# Patient Record
Sex: Female | Born: 1945
Health system: Southern US, Community
[De-identification: ages and names within clinical notes are randomized; demographics above are authoritative.]

## PROBLEM LIST (undated history)

## (undated) DIAGNOSIS — O009 Unspecified ectopic pregnancy without intrauterine pregnancy: Secondary | ICD-10-CM

## (undated) DIAGNOSIS — D649 Anemia, unspecified: Secondary | ICD-10-CM

## (undated) DIAGNOSIS — I10 Essential (primary) hypertension: Secondary | ICD-10-CM

## (undated) DIAGNOSIS — K589 Irritable bowel syndrome without diarrhea: Secondary | ICD-10-CM

## (undated) DIAGNOSIS — D571 Sickle-cell disease without crisis: Secondary | ICD-10-CM

## (undated) DIAGNOSIS — K635 Polyp of colon: Secondary | ICD-10-CM

## (undated) DIAGNOSIS — M810 Age-related osteoporosis without current pathological fracture: Secondary | ICD-10-CM

## (undated) DIAGNOSIS — G4733 Obstructive sleep apnea (adult) (pediatric): Secondary | ICD-10-CM

## (undated) DIAGNOSIS — R197 Diarrhea, unspecified: Secondary | ICD-10-CM

## (undated) DIAGNOSIS — K219 Gastro-esophageal reflux disease without esophagitis: Secondary | ICD-10-CM

## (undated) DIAGNOSIS — D573 Sickle-cell trait: Secondary | ICD-10-CM

## (undated) DIAGNOSIS — H269 Unspecified cataract: Secondary | ICD-10-CM

## (undated) DIAGNOSIS — E559 Vitamin D deficiency, unspecified: Secondary | ICD-10-CM

## (undated) DIAGNOSIS — G4730 Sleep apnea, unspecified: Secondary | ICD-10-CM

## (undated) DIAGNOSIS — Z9989 Dependence on other enabling machines and devices: Principal | ICD-10-CM

## (undated) DIAGNOSIS — M1990 Unspecified osteoarthritis, unspecified site: Secondary | ICD-10-CM

## (undated) DIAGNOSIS — M722 Plantar fascial fibromatosis: Secondary | ICD-10-CM

## (undated) DIAGNOSIS — E739 Lactose intolerance, unspecified: Secondary | ICD-10-CM

## (undated) HISTORY — DX: Lactose intolerance, unspecified: E73.9

## (undated) HISTORY — PX: ABDOMINAL HYSTERECTOMY: SHX81

## (undated) HISTORY — DX: Age-related osteoporosis without current pathological fracture: M81.0

## (undated) HISTORY — DX: Irritable bowel syndrome, unspecified: K58.9

## (undated) HISTORY — DX: Polyp of colon: K63.5

## (undated) HISTORY — DX: Diarrhea, unspecified: R19.7

## (undated) HISTORY — DX: Essential (primary) hypertension: I10

## (undated) HISTORY — DX: Plantar fascial fibromatosis: M72.2

## (undated) HISTORY — DX: Dependence on other enabling machines and devices: Z99.89

## (undated) HISTORY — DX: Sickle-cell trait: D57.3

## (undated) HISTORY — DX: Sickle-cell disease without crisis: D57.1

## (undated) HISTORY — DX: Vitamin D deficiency, unspecified: E55.9

## (undated) HISTORY — DX: Unspecified ectopic pregnancy without intrauterine pregnancy: O00.90

## (undated) HISTORY — PX: TUBAL LIGATION: SHX77

## (undated) HISTORY — DX: Unspecified cataract: H26.9

## (undated) HISTORY — DX: Anemia, unspecified: D64.9

## (undated) HISTORY — DX: Sleep apnea, unspecified: G47.30

## (undated) HISTORY — DX: Obstructive sleep apnea (adult) (pediatric): G47.33

---

## 1997-03-31 HISTORY — PX: BREAST BIOPSY: SHX20

## 1997-09-25 ENCOUNTER — Encounter: Admission: RE | Admit: 1997-09-25 | Discharge: 1997-09-25 | Payer: Self-pay | Admitting: Obstetrics

## 1998-03-26 ENCOUNTER — Encounter: Admission: RE | Admit: 1998-03-26 | Discharge: 1998-03-26 | Payer: Self-pay | Admitting: Obstetrics

## 1998-07-09 ENCOUNTER — Encounter: Admission: RE | Admit: 1998-07-09 | Discharge: 1998-07-09 | Payer: Self-pay | Admitting: Obstetrics

## 1998-07-23 ENCOUNTER — Encounter: Admission: RE | Admit: 1998-07-23 | Discharge: 1998-07-23 | Payer: Self-pay | Admitting: Obstetrics

## 1998-09-17 ENCOUNTER — Encounter: Admission: RE | Admit: 1998-09-17 | Discharge: 1998-09-17 | Payer: Self-pay | Admitting: Obstetrics

## 1998-11-05 ENCOUNTER — Ambulatory Visit (HOSPITAL_COMMUNITY): Admission: RE | Admit: 1998-11-05 | Discharge: 1998-11-05 | Payer: Self-pay | Admitting: Gastroenterology

## 2000-05-08 ENCOUNTER — Encounter: Payer: Self-pay | Admitting: Emergency Medicine

## 2000-05-08 ENCOUNTER — Emergency Department (HOSPITAL_COMMUNITY): Admission: EM | Admit: 2000-05-08 | Discharge: 2000-05-08 | Payer: Self-pay | Admitting: Emergency Medicine

## 2001-12-30 ENCOUNTER — Emergency Department (HOSPITAL_COMMUNITY): Admission: EM | Admit: 2001-12-30 | Discharge: 2001-12-31 | Payer: Self-pay

## 2001-12-30 ENCOUNTER — Encounter: Payer: Self-pay | Admitting: Emergency Medicine

## 2002-01-04 ENCOUNTER — Encounter: Payer: Self-pay | Admitting: General Surgery

## 2002-01-07 ENCOUNTER — Encounter: Payer: Self-pay | Admitting: General Surgery

## 2002-01-07 ENCOUNTER — Encounter (INDEPENDENT_AMBULATORY_CARE_PROVIDER_SITE_OTHER): Payer: Self-pay | Admitting: Specialist

## 2002-01-08 ENCOUNTER — Inpatient Hospital Stay (HOSPITAL_COMMUNITY): Admission: EM | Admit: 2002-01-08 | Discharge: 2002-01-09 | Payer: Self-pay | Admitting: General Surgery

## 2003-07-03 ENCOUNTER — Ambulatory Visit: Admission: RE | Admit: 2003-07-03 | Discharge: 2003-07-03 | Payer: Self-pay | Admitting: Internal Medicine

## 2003-07-03 ENCOUNTER — Encounter (INDEPENDENT_AMBULATORY_CARE_PROVIDER_SITE_OTHER): Payer: Self-pay | Admitting: Cardiology

## 2004-06-07 ENCOUNTER — Ambulatory Visit: Payer: Self-pay | Admitting: *Deleted

## 2004-06-07 ENCOUNTER — Ambulatory Visit: Payer: Self-pay | Admitting: Family Medicine

## 2004-06-10 ENCOUNTER — Ambulatory Visit: Payer: Self-pay | Admitting: Family Medicine

## 2004-08-11 ENCOUNTER — Ambulatory Visit: Payer: Self-pay | Admitting: Internal Medicine

## 2004-10-29 ENCOUNTER — Emergency Department (HOSPITAL_COMMUNITY): Admission: EM | Admit: 2004-10-29 | Discharge: 2004-10-30 | Payer: Self-pay | Admitting: Emergency Medicine

## 2004-11-08 ENCOUNTER — Emergency Department (HOSPITAL_COMMUNITY): Admission: EM | Admit: 2004-11-08 | Discharge: 2004-11-08 | Payer: Self-pay | Admitting: Emergency Medicine

## 2005-03-02 ENCOUNTER — Ambulatory Visit: Payer: Self-pay | Admitting: Family Medicine

## 2005-04-13 ENCOUNTER — Ambulatory Visit: Payer: Self-pay | Admitting: Family Medicine

## 2005-06-03 ENCOUNTER — Ambulatory Visit: Payer: Self-pay | Admitting: Family Medicine

## 2005-07-21 ENCOUNTER — Ambulatory Visit: Payer: Self-pay | Admitting: Family Medicine

## 2006-02-03 ENCOUNTER — Ambulatory Visit: Payer: Self-pay | Admitting: Family Medicine

## 2006-02-17 ENCOUNTER — Ambulatory Visit: Payer: Self-pay | Admitting: Family Medicine

## 2006-02-28 ENCOUNTER — Ambulatory Visit: Payer: Self-pay | Admitting: Family Medicine

## 2006-05-30 HISTORY — PX: CHOLECYSTECTOMY: SHX55

## 2006-08-15 ENCOUNTER — Ambulatory Visit: Payer: Self-pay | Admitting: Family Medicine

## 2006-08-17 ENCOUNTER — Ambulatory Visit: Payer: Self-pay | Admitting: Internal Medicine

## 2006-09-12 ENCOUNTER — Ambulatory Visit: Payer: Self-pay | Admitting: Family Medicine

## 2006-10-12 ENCOUNTER — Ambulatory Visit: Payer: Self-pay | Admitting: Family Medicine

## 2006-12-05 ENCOUNTER — Ambulatory Visit: Payer: Self-pay | Admitting: Family Medicine

## 2007-02-14 ENCOUNTER — Encounter (INDEPENDENT_AMBULATORY_CARE_PROVIDER_SITE_OTHER): Payer: Self-pay | Admitting: *Deleted

## 2007-05-31 HISTORY — PX: COLONOSCOPY: SHX174

## 2007-08-06 ENCOUNTER — Ambulatory Visit: Payer: Self-pay | Admitting: Family Medicine

## 2007-08-06 ENCOUNTER — Encounter (INDEPENDENT_AMBULATORY_CARE_PROVIDER_SITE_OTHER): Payer: Self-pay | Admitting: Family Medicine

## 2007-08-06 LAB — CONVERTED CEMR LAB
ALT: 20 units/L (ref 0–35)
Albumin: 4.2 g/dL (ref 3.5–5.2)
BUN: 8 mg/dL (ref 6–23)
Basophils Relative: 0 % (ref 0–1)
CO2: 27 meq/L (ref 19–32)
Chloride: 100 meq/L (ref 96–112)
Creatinine, Ser: 0.72 mg/dL (ref 0.40–1.20)
Eosinophils Absolute: 0.1 10*3/uL (ref 0.0–0.7)
Hemoglobin: 13.2 g/dL (ref 12.0–15.0)
RBC: 4.89 M/uL (ref 3.87–5.11)
Sodium: 140 meq/L (ref 135–145)
Total Bilirubin: 0.5 mg/dL (ref 0.3–1.2)

## 2007-08-29 ENCOUNTER — Ambulatory Visit: Payer: Self-pay | Admitting: Internal Medicine

## 2007-08-31 ENCOUNTER — Ambulatory Visit: Payer: Self-pay | Admitting: Internal Medicine

## 2007-09-03 ENCOUNTER — Ambulatory Visit: Payer: Self-pay | Admitting: Internal Medicine

## 2008-01-21 ENCOUNTER — Ambulatory Visit: Payer: Self-pay | Admitting: Internal Medicine

## 2008-01-21 DIAGNOSIS — K589 Irritable bowel syndrome without diarrhea: Secondary | ICD-10-CM

## 2008-01-24 ENCOUNTER — Ambulatory Visit: Payer: Self-pay | Admitting: Internal Medicine

## 2008-01-24 ENCOUNTER — Encounter: Payer: Self-pay | Admitting: Internal Medicine

## 2008-01-28 ENCOUNTER — Telehealth (INDEPENDENT_AMBULATORY_CARE_PROVIDER_SITE_OTHER): Payer: Self-pay

## 2008-01-30 ENCOUNTER — Telehealth: Payer: Self-pay | Admitting: Internal Medicine

## 2008-09-05 ENCOUNTER — Ambulatory Visit: Payer: Self-pay | Admitting: Family Medicine

## 2008-09-11 ENCOUNTER — Ambulatory Visit (HOSPITAL_COMMUNITY): Admission: RE | Admit: 2008-09-11 | Discharge: 2008-09-11 | Payer: Self-pay | Admitting: Family Medicine

## 2008-09-23 ENCOUNTER — Ambulatory Visit: Payer: Self-pay | Admitting: Internal Medicine

## 2008-09-25 ENCOUNTER — Ambulatory Visit: Payer: Self-pay | Admitting: Internal Medicine

## 2008-10-09 ENCOUNTER — Ambulatory Visit: Payer: Self-pay | Admitting: Internal Medicine

## 2008-11-13 ENCOUNTER — Ambulatory Visit: Payer: Self-pay | Admitting: Internal Medicine

## 2009-02-10 ENCOUNTER — Telehealth: Payer: Self-pay | Admitting: Internal Medicine

## 2009-03-19 ENCOUNTER — Ambulatory Visit: Payer: Self-pay | Admitting: Internal Medicine

## 2009-03-20 ENCOUNTER — Telehealth: Payer: Self-pay | Admitting: Internal Medicine

## 2009-08-26 ENCOUNTER — Encounter (INDEPENDENT_AMBULATORY_CARE_PROVIDER_SITE_OTHER): Payer: Self-pay | Admitting: Adult Health

## 2009-08-26 ENCOUNTER — Ambulatory Visit: Payer: Self-pay | Admitting: Internal Medicine

## 2009-08-26 LAB — CONVERTED CEMR LAB
Basophils Relative: 1 % (ref 0–1)
GC Probe Amp, Urine: NEGATIVE
HCT: 44.7 % (ref 36.0–46.0)
HDL: 54 mg/dL (ref >39)
LDL Cholesterol: 50 mg/dL (ref 0–99)
Lymphocytes Relative: 44 % (ref 12–46)
Lymphs Abs: 1.8 10*3/uL (ref 0.7–4.0)
Microalb, Ur: 7.32 mg/dL — ABNORMAL HIGH (ref 0.00–1.89)
Monocytes Absolute: 0.3 10*3/uL (ref 0.1–1.0)
Neutro Abs: 1.9 10*3/uL (ref 1.7–7.7)
Platelets: 292 10*3/uL (ref 150–400)
RBC: 5.55 M/uL — ABNORMAL HIGH (ref 3.87–5.11)
VLDL: 32 mg/dL (ref 0–40)

## 2009-09-14 ENCOUNTER — Ambulatory Visit: Payer: Self-pay | Admitting: Family Medicine

## 2009-09-14 ENCOUNTER — Ambulatory Visit (HOSPITAL_COMMUNITY): Admission: RE | Admit: 2009-09-14 | Discharge: 2009-09-14 | Payer: Self-pay | Admitting: Internal Medicine

## 2009-09-16 ENCOUNTER — Ambulatory Visit: Payer: Self-pay | Admitting: Internal Medicine

## 2009-11-18 ENCOUNTER — Emergency Department (HOSPITAL_COMMUNITY): Admission: EM | Admit: 2009-11-18 | Discharge: 2009-11-18 | Payer: Self-pay | Admitting: Family Medicine

## 2010-09-07 ENCOUNTER — Other Ambulatory Visit (HOSPITAL_COMMUNITY): Payer: Self-pay | Admitting: Family Medicine

## 2010-09-07 DIAGNOSIS — M8580 Other specified disorders of bone density and structure, unspecified site: Secondary | ICD-10-CM

## 2010-09-07 DIAGNOSIS — Z1231 Encounter for screening mammogram for malignant neoplasm of breast: Secondary | ICD-10-CM

## 2010-09-09 ENCOUNTER — Ambulatory Visit (HOSPITAL_COMMUNITY)
Admission: RE | Admit: 2010-09-09 | Discharge: 2010-09-09 | Disposition: A | Discharge status: Home / Self Care | Payer: Self-pay | Source: Ambulatory Visit | Attending: Family Medicine | Admitting: Family Medicine

## 2010-09-09 ENCOUNTER — Other Ambulatory Visit (HOSPITAL_COMMUNITY): Payer: Self-pay | Admitting: Family Medicine

## 2010-09-09 DIAGNOSIS — M899 Disorder of bone, unspecified: Secondary | ICD-10-CM | POA: Insufficient documentation

## 2010-09-09 DIAGNOSIS — R52 Pain, unspecified: Secondary | ICD-10-CM

## 2010-09-09 DIAGNOSIS — M8580 Other specified disorders of bone density and structure, unspecified site: Secondary | ICD-10-CM

## 2010-09-16 ENCOUNTER — Ambulatory Visit (HOSPITAL_COMMUNITY)
Admission: RE | Admit: 2010-09-16 | Discharge: 2010-09-16 | Disposition: A | Discharge status: Home / Self Care | Payer: Self-pay | Source: Ambulatory Visit | Attending: Family Medicine | Admitting: Family Medicine

## 2010-09-16 DIAGNOSIS — Z1231 Encounter for screening mammogram for malignant neoplasm of breast: Secondary | ICD-10-CM | POA: Insufficient documentation

## 2010-10-15 NOTE — Op Note (Signed)
Laura Robles, Laura Robles                      ACCOUNT NO.:  [id number]   MEDICAL RECORD NO.:  [id number]                   PATIENT TYPE:  INP   LOCATION:  0270                                 FACILITY:  [facility]   PHYSICIAN:  Adolph Pollack, M.D.            DATE OF BIRTH:  1946-02-03   DATE OF PROCEDURE:  01/07/2002  DATE OF DISCHARGE:                                 OPERATIVE REPORT   PREOPERATIVE DIAGNOSIS:  Symptomatic cholelithiasis.   POSTOPERATIVE DIAGNOSIS:  Acute and chronic cholecystitis.   PROCEDURE:  Laparoscopic cholecystectomy with intraoperative cholangiogram.   SURGEON:  Adolph Pollack, M.D.   ASSISTANT:  Ollen Gross. Carolynne Edouard, M.D.   ANESTHESIA:  General.   INDICATION:  This 65 year old female was seen in the emergency department  with an acute gallbladder attack and noted to have normal liver function  tests and normal white count.  She was seen in the office and scheduled for  __________ and cholecystectomy.  The procedure and the risks were explained  to her preoperatively.   TECHNIQUE:  She was placed supine on the operating table, and a general  anesthetic was administered.  Her abdomen was sterilely prepped and draped.  Local anesthetic was infiltrated in the infraumbilical region, and an  infraumbilical incision was made, incising the skin and subcutaneous tissue  sharply.  The midline fascia was identified and a small incision made in the  midline fascia.  The peritoneal cavity was entered bluntly and under direct  vision.  A pursestring suture of 0 Vicryl was placed around the fascial  edges.  A Hasson trocar was introduced into the peritoneal cavity, and a  pneumoperitoneum was created by insufflation of CO2 gas.  Next, the  laparoscope was introduced.  The patient was placed in a reverse  Trendelenburg position and rotated slightly to the left.  Of note was that  there was omentum adherent to the fundus of the gallbladder.  An 11 mm  trocar was  placed through an epigastric incision and two 5 mm trocars placed  in right abdomen.  The omentum was densely adherent to a distended, inflamed  gallbladder.  I decompressed the gallbladder by placing needle through and  milking out white bile.  I subsequently was able to dissect the omentum off  the fundus of the gallbladder with blunt dissection and cautery.  I used  blunt dissection to identify the body and neck of the gallbladder as well as  identify the infundibulum.  I mobilized the infundibulum of the gallbladder  completely.  I made a small hole in the infundibulum of the gallbladder and  placed a cholangiocatheter through it, performed a cholangiogram.   Under real-time fluoroscopy, contrast material was injected into what  appeared to be the neck of the gallbladder, then drained into a moderate  length cystic duct and then in the common bile duct.  The common hepatic  ducts were seen and appeared to be  patent as did the common bile duct.  The  contrast did splash into the duodenum without delay without evidence of  obstruction.   I removed the cholangiocatheter and noted there was quite a bit of fibrous  reaction around the cystic duct more so than __________ occluded with a  clip.  Subsequently, I brought an EndoGIA stapler and divided the cystic  duct and the surrounding inflammatory response with this.  I clipped and  divided the cystic artery.  The gallbladder was then dissected free from the  liver bed, although there was minimal if any plane and leaving a raw surface  of liver after gallbladder was removed.  It was placed in an Endopouch bag.   I thoroughly irrigated out the perihepatic area and then controlled bleeding  with cautery.  I then placed Surgicel against the raw surface of the liver  and inspected it one more time and noticed no bile leakage or no bleeding.  I irrigated the area with approximately 1.5 liters of solution and evacuated  as much as possible.  I  then placed a drain in the abdominal cavity and  brought it out through the lateral right 5 mm trocar anchored to the skin  with 3-0 nylon suture.  A drain was placed in the gallbladder fossa.  I then  removed the gallbladder through the subumbilical port.  The stone was so  large I had to enlarge the fascial incision.  Once I did this, I closed the  fascia under direct vision with interrupted 0 Vicryl sutures in the  subumbilical region.  I then released the pneumoperitoneum and removed the  trocars.  The remaining skin incisions were closed with 4-0 Monocryl  subcuticular stitches.  Steri-Strips and sterile dressings were applied.   She tolerated the procedure well without any apparent complications and was  taken to the recovery room in satisfactory condition.                                               Adolph Pollack, M.D.    Kari Baars  D:  01/07/2002  T:  01/09/2002  Job:  [id number]

## 2011-08-11 ENCOUNTER — Other Ambulatory Visit (HOSPITAL_COMMUNITY): Payer: Self-pay | Admitting: Internal Medicine

## 2011-08-11 DIAGNOSIS — Z1231 Encounter for screening mammogram for malignant neoplasm of breast: Secondary | ICD-10-CM

## 2011-09-16 ENCOUNTER — Other Ambulatory Visit (HOSPITAL_COMMUNITY): Payer: Self-pay | Admitting: Family Medicine

## 2011-09-16 ENCOUNTER — Ambulatory Visit (HOSPITAL_COMMUNITY)
Admission: RE | Admit: 2011-09-16 | Discharge: 2011-09-16 | Disposition: A | Discharge status: Home / Self Care | Payer: Self-pay | Source: Ambulatory Visit | Attending: Family Medicine | Admitting: Family Medicine

## 2011-09-16 DIAGNOSIS — R52 Pain, unspecified: Secondary | ICD-10-CM

## 2011-09-16 DIAGNOSIS — M25569 Pain in unspecified knee: Secondary | ICD-10-CM | POA: Insufficient documentation

## 2011-09-19 ENCOUNTER — Ambulatory Visit (HOSPITAL_COMMUNITY)
Admission: RE | Admit: 2011-09-19 | Discharge: 2011-09-19 | Disposition: A | Discharge status: Home / Self Care | Payer: Self-pay | Source: Ambulatory Visit | Attending: Internal Medicine | Admitting: Internal Medicine

## 2011-09-19 DIAGNOSIS — Z1231 Encounter for screening mammogram for malignant neoplasm of breast: Secondary | ICD-10-CM | POA: Insufficient documentation

## 2012-09-24 ENCOUNTER — Other Ambulatory Visit (HOSPITAL_COMMUNITY): Payer: Self-pay | Admitting: Internal Medicine

## 2012-09-24 DIAGNOSIS — Z1231 Encounter for screening mammogram for malignant neoplasm of breast: Secondary | ICD-10-CM

## 2012-09-25 ENCOUNTER — Ambulatory Visit (HOSPITAL_COMMUNITY): Payer: Medicare Other

## 2012-09-27 ENCOUNTER — Ambulatory Visit (HOSPITAL_COMMUNITY): Payer: Self-pay

## 2012-10-10 ENCOUNTER — Ambulatory Visit (HOSPITAL_COMMUNITY)
Admission: RE | Admit: 2012-10-10 | Discharge: 2012-10-10 | Disposition: A | Discharge status: Home / Self Care | Payer: Medicare Other | Source: Ambulatory Visit | Attending: Internal Medicine | Admitting: Internal Medicine

## 2012-10-10 DIAGNOSIS — Z1231 Encounter for screening mammogram for malignant neoplasm of breast: Secondary | ICD-10-CM | POA: Insufficient documentation

## 2013-09-05 ENCOUNTER — Other Ambulatory Visit (HOSPITAL_COMMUNITY): Payer: Self-pay | Admitting: Internal Medicine

## 2013-09-05 DIAGNOSIS — Z1231 Encounter for screening mammogram for malignant neoplasm of breast: Secondary | ICD-10-CM

## 2013-10-15 ENCOUNTER — Ambulatory Visit (HOSPITAL_COMMUNITY)
Admission: RE | Admit: 2013-10-15 | Discharge: 2013-10-15 | Disposition: A | Discharge status: Home / Self Care | Payer: Medicare HMO | Source: Ambulatory Visit | Attending: Internal Medicine | Admitting: Internal Medicine

## 2013-10-15 DIAGNOSIS — Z1231 Encounter for screening mammogram for malignant neoplasm of breast: Secondary | ICD-10-CM | POA: Insufficient documentation

## 2014-08-15 DIAGNOSIS — E559 Vitamin D deficiency, unspecified: Secondary | ICD-10-CM | POA: Diagnosis not present

## 2014-08-15 DIAGNOSIS — I10 Essential (primary) hypertension: Secondary | ICD-10-CM | POA: Diagnosis not present

## 2014-08-20 DIAGNOSIS — M810 Age-related osteoporosis without current pathological fracture: Secondary | ICD-10-CM | POA: Diagnosis not present

## 2014-08-20 DIAGNOSIS — I10 Essential (primary) hypertension: Secondary | ICD-10-CM | POA: Diagnosis not present

## 2014-08-20 DIAGNOSIS — Z6830 Body mass index (BMI) 30.0-30.9, adult: Secondary | ICD-10-CM | POA: Diagnosis not present

## 2014-08-20 DIAGNOSIS — Z23 Encounter for immunization: Secondary | ICD-10-CM | POA: Diagnosis not present

## 2014-08-20 DIAGNOSIS — E559 Vitamin D deficiency, unspecified: Secondary | ICD-10-CM | POA: Diagnosis not present

## 2014-08-20 DIAGNOSIS — Z1212 Encounter for screening for malignant neoplasm of rectum: Secondary | ICD-10-CM | POA: Diagnosis not present

## 2014-08-20 DIAGNOSIS — Z1389 Encounter for screening for other disorder: Secondary | ICD-10-CM | POA: Diagnosis not present

## 2014-08-20 DIAGNOSIS — K589 Irritable bowel syndrome without diarrhea: Secondary | ICD-10-CM | POA: Diagnosis not present

## 2014-08-20 DIAGNOSIS — Z0000 Encounter for general adult medical examination without abnormal findings: Secondary | ICD-10-CM | POA: Diagnosis not present

## 2014-09-19 ENCOUNTER — Other Ambulatory Visit (HOSPITAL_COMMUNITY): Payer: Self-pay | Admitting: Internal Medicine

## 2014-09-19 DIAGNOSIS — Z1231 Encounter for screening mammogram for malignant neoplasm of breast: Secondary | ICD-10-CM

## 2014-10-21 DIAGNOSIS — M810 Age-related osteoporosis without current pathological fracture: Secondary | ICD-10-CM | POA: Diagnosis not present

## 2014-10-21 DIAGNOSIS — E559 Vitamin D deficiency, unspecified: Secondary | ICD-10-CM | POA: Diagnosis not present

## 2014-10-22 ENCOUNTER — Ambulatory Visit (HOSPITAL_COMMUNITY)
Admission: RE | Admit: 2014-10-22 | Discharge: 2014-10-22 | Disposition: A | Discharge status: Home / Self Care | Payer: Commercial Managed Care - HMO | Source: Ambulatory Visit | Attending: Internal Medicine | Admitting: Internal Medicine

## 2014-10-22 DIAGNOSIS — Z1231 Encounter for screening mammogram for malignant neoplasm of breast: Secondary | ICD-10-CM | POA: Diagnosis not present

## 2014-11-25 DIAGNOSIS — M545 Low back pain: Secondary | ICD-10-CM | POA: Diagnosis not present

## 2014-11-25 DIAGNOSIS — Z9181 History of falling: Secondary | ICD-10-CM | POA: Diagnosis not present

## 2014-11-25 DIAGNOSIS — Z6830 Body mass index (BMI) 30.0-30.9, adult: Secondary | ICD-10-CM | POA: Diagnosis not present

## 2014-11-26 DIAGNOSIS — Z9181 History of falling: Secondary | ICD-10-CM | POA: Diagnosis not present

## 2015-01-02 ENCOUNTER — Encounter: Payer: Self-pay | Admitting: Internal Medicine

## 2015-04-21 DIAGNOSIS — Z0100 Encounter for examination of eyes and vision without abnormal findings: Secondary | ICD-10-CM | POA: Diagnosis not present

## 2015-05-29 DIAGNOSIS — Z0100 Encounter for examination of eyes and vision without abnormal findings: Secondary | ICD-10-CM | POA: Diagnosis not present

## 2015-08-21 DIAGNOSIS — M810 Age-related osteoporosis without current pathological fracture: Secondary | ICD-10-CM | POA: Diagnosis not present

## 2015-08-21 DIAGNOSIS — I10 Essential (primary) hypertension: Secondary | ICD-10-CM | POA: Diagnosis not present

## 2015-08-21 DIAGNOSIS — E559 Vitamin D deficiency, unspecified: Secondary | ICD-10-CM | POA: Diagnosis not present

## 2015-09-04 DIAGNOSIS — M810 Age-related osteoporosis without current pathological fracture: Secondary | ICD-10-CM | POA: Diagnosis not present

## 2015-09-04 DIAGNOSIS — K589 Irritable bowel syndrome without diarrhea: Secondary | ICD-10-CM | POA: Diagnosis not present

## 2015-09-04 DIAGNOSIS — G629 Polyneuropathy, unspecified: Secondary | ICD-10-CM | POA: Diagnosis not present

## 2015-09-04 DIAGNOSIS — I10 Essential (primary) hypertension: Secondary | ICD-10-CM | POA: Diagnosis not present

## 2015-09-04 DIAGNOSIS — R808 Other proteinuria: Secondary | ICD-10-CM | POA: Diagnosis not present

## 2015-09-04 DIAGNOSIS — E559 Vitamin D deficiency, unspecified: Secondary | ICD-10-CM | POA: Diagnosis not present

## 2015-09-04 DIAGNOSIS — Z1389 Encounter for screening for other disorder: Secondary | ICD-10-CM | POA: Diagnosis not present

## 2015-09-04 DIAGNOSIS — Z6831 Body mass index (BMI) 31.0-31.9, adult: Secondary | ICD-10-CM | POA: Diagnosis not present

## 2015-09-04 DIAGNOSIS — Z0000 Encounter for general adult medical examination without abnormal findings: Secondary | ICD-10-CM | POA: Diagnosis not present

## 2015-09-09 DIAGNOSIS — Z1212 Encounter for screening for malignant neoplasm of rectum: Secondary | ICD-10-CM | POA: Diagnosis not present

## 2015-10-27 ENCOUNTER — Other Ambulatory Visit: Payer: Self-pay

## 2015-10-27 DIAGNOSIS — Z1231 Encounter for screening mammogram for malignant neoplasm of breast: Secondary | ICD-10-CM

## 2015-10-30 ENCOUNTER — Ambulatory Visit: Payer: Medicare HMO

## 2015-11-04 ENCOUNTER — Other Ambulatory Visit: Payer: Self-pay | Admitting: Internal Medicine

## 2015-11-04 ENCOUNTER — Ambulatory Visit
Admission: RE | Admit: 2015-11-04 | Discharge: 2015-11-04 | Disposition: A | Discharge status: Home / Self Care | Payer: Commercial Managed Care - HMO | Source: Ambulatory Visit

## 2015-11-04 DIAGNOSIS — Z1231 Encounter for screening mammogram for malignant neoplasm of breast: Secondary | ICD-10-CM

## 2016-01-25 DIAGNOSIS — H6121 Impacted cerumen, right ear: Secondary | ICD-10-CM | POA: Diagnosis not present

## 2016-01-25 DIAGNOSIS — H9201 Otalgia, right ear: Secondary | ICD-10-CM | POA: Diagnosis not present

## 2016-02-08 DIAGNOSIS — H9201 Otalgia, right ear: Secondary | ICD-10-CM | POA: Diagnosis not present

## 2016-02-08 DIAGNOSIS — Z6830 Body mass index (BMI) 30.0-30.9, adult: Secondary | ICD-10-CM | POA: Diagnosis not present

## 2016-02-24 DIAGNOSIS — H60501 Unspecified acute noninfective otitis externa, right ear: Secondary | ICD-10-CM | POA: Diagnosis not present

## 2016-03-01 DIAGNOSIS — L299 Pruritus, unspecified: Secondary | ICD-10-CM | POA: Diagnosis not present

## 2016-04-13 DIAGNOSIS — Z0100 Encounter for examination of eyes and vision without abnormal findings: Secondary | ICD-10-CM | POA: Diagnosis not present

## 2016-05-30 DIAGNOSIS — G4733 Obstructive sleep apnea (adult) (pediatric): Secondary | ICD-10-CM

## 2016-05-30 HISTORY — DX: Obstructive sleep apnea (adult) (pediatric): G47.33

## 2016-06-21 DIAGNOSIS — Z6831 Body mass index (BMI) 31.0-31.9, adult: Secondary | ICD-10-CM | POA: Diagnosis not present

## 2016-06-21 DIAGNOSIS — R05 Cough: Secondary | ICD-10-CM | POA: Diagnosis not present

## 2016-06-21 DIAGNOSIS — J329 Chronic sinusitis, unspecified: Secondary | ICD-10-CM | POA: Diagnosis not present

## 2016-06-21 DIAGNOSIS — J029 Acute pharyngitis, unspecified: Secondary | ICD-10-CM | POA: Diagnosis not present

## 2016-08-31 DIAGNOSIS — I10 Essential (primary) hypertension: Secondary | ICD-10-CM | POA: Diagnosis not present

## 2016-08-31 DIAGNOSIS — E559 Vitamin D deficiency, unspecified: Secondary | ICD-10-CM | POA: Diagnosis not present

## 2016-09-06 DIAGNOSIS — R808 Other proteinuria: Secondary | ICD-10-CM | POA: Diagnosis not present

## 2016-09-06 DIAGNOSIS — M810 Age-related osteoporosis without current pathological fracture: Secondary | ICD-10-CM | POA: Diagnosis not present

## 2016-09-06 DIAGNOSIS — Z0000 Encounter for general adult medical examination without abnormal findings: Secondary | ICD-10-CM | POA: Diagnosis not present

## 2016-09-06 DIAGNOSIS — G6289 Other specified polyneuropathies: Secondary | ICD-10-CM | POA: Diagnosis not present

## 2016-09-06 DIAGNOSIS — K589 Irritable bowel syndrome without diarrhea: Secondary | ICD-10-CM | POA: Diagnosis not present

## 2016-09-06 DIAGNOSIS — E559 Vitamin D deficiency, unspecified: Secondary | ICD-10-CM | POA: Diagnosis not present

## 2016-09-06 DIAGNOSIS — I10 Essential (primary) hypertension: Secondary | ICD-10-CM | POA: Diagnosis not present

## 2016-09-06 DIAGNOSIS — R0683 Snoring: Secondary | ICD-10-CM | POA: Diagnosis not present

## 2016-09-06 DIAGNOSIS — Z1389 Encounter for screening for other disorder: Secondary | ICD-10-CM | POA: Diagnosis not present

## 2016-09-08 ENCOUNTER — Ambulatory Visit (INDEPENDENT_AMBULATORY_CARE_PROVIDER_SITE_OTHER): Payer: Medicare HMO | Admitting: Neurology

## 2016-09-08 ENCOUNTER — Encounter (INDEPENDENT_AMBULATORY_CARE_PROVIDER_SITE_OTHER): Payer: Self-pay

## 2016-09-08 ENCOUNTER — Encounter: Payer: Self-pay | Admitting: Neurology

## 2016-09-08 VITALS — BP 139/78 | HR 80 | Resp 20 | Ht 63.0 in | Wt 183.0 lb

## 2016-09-08 DIAGNOSIS — R400 Somnolence: Secondary | ICD-10-CM | POA: Diagnosis not present

## 2016-09-08 DIAGNOSIS — R0683 Snoring: Secondary | ICD-10-CM | POA: Diagnosis not present

## 2016-09-08 DIAGNOSIS — E669 Obesity, unspecified: Secondary | ICD-10-CM | POA: Diagnosis not present

## 2016-09-08 DIAGNOSIS — R0681 Apnea, not elsewhere classified: Secondary | ICD-10-CM

## 2016-09-08 NOTE — Progress Notes (Signed)
Subjective:    Patient ID: Laura Robles is a 71 y.o. female.  HPI     Huston Foley, MD, PhD [facility] [address], [address] [address] [place], [place] [address]  Dear Dr. Link Snuffer,   I saw your patient, Laura Robles, upon your kind request in my neurologic clinic today for initial consultation of her sleep disorder, in particular, concern for underlying obstructive sleep apnea. The patient is unaccompanied today. As you know, Laura Robles is a 72 year old right-handed woman with an underlying medical history of osteoporosis, plantar fasciitis, lactose intolerance, IBS, cataracts, hypertension, vitamin D deficiency and obesity, who reports snoring and excessive daytime somnolence.  I reviewed your office note from 09/06/2016, which you kindly included. The patient is single, she lives alone. She quit smoking in 1985, she quit drinking alcohol in 1990, does not drink caffeine on a daily basis. She's a retired Lawyer. She has 2 grown sons, 7 grandchildren, one great-grandchild and one great grandchild on the way. She does not have a set bedtime and wake time. She watches TV in bed at times. Bedtime may vary from 10 PM to 2 AM. Wake up time varies from 7 AM to 10 AM. She does not have night to night nocturia or recurrent morning headaches. She has she had a bedroom before with her friend on a trip out of town and was told that she has loud snoring and also pauses in her breathing. Her grandchildren also reports for snoring to her. She does not always wake up rested. Epworth sleepiness score is 10 out of 24, fatigue score is 12 out of 63. She denies restless leg symptoms but has leg aching at times. She is not aware of any family history of OSA.  Her Past Medical History Is Significant For: Past Medical History:  Diagnosis Date  . Cataracts, bilateral   . Ectopic pregnancy   . Hypertension   . Lactose intolerance   . Osteoporosis   . Plantar fasciitis   . Vitamin  D deficiency     Her Past Surgical History Is Significant For: Past Surgical History:  Procedure Laterality Date  . ABDOMINAL HYSTERECTOMY    . CHOLECYSTECTOMY  2008  . TUBAL LIGATION      Her Family History Is Significant For: Family History  Problem Relation Age of Onset  . Heart attack Father   . Stroke Father   . Diabetes Son     Her Social History Is Significant For: Social History   Social History  . Marital status: Single    Spouse name: N/A  . Number of children: N/A  . Years of education: N/A   Social History Main Topics  . Smoking status: Former Games developer  . Smokeless tobacco: Never Used  . Alcohol use No  . Drug use: No  . Sexual activity: Not on file   Other Topics Concern  . Not on file   Social History Narrative  . No narrative on file    Her Allergies Are:  Allergies  Allergen Reactions  . Ibuprofen   . Penicillins   :   Her Current Medications Are:  Outpatient Encounter Prescriptions as of 09/08/2016  Medication Sig  . alendronate (FOSAMAX) 70 MG tablet Take 70 mg by mouth once a week. Take with a full glass of water on an empty stomach.  . dicyclomine (BENTYL) 20 MG tablet Take 20 mg by mouth daily as needed.  Marland Kitchen lisinopril-hydrochlorothiazide (PRINZIDE,ZESTORETIC) 10-12.5 MG tablet Take 1 tablet  by mouth daily.  Marland Kitchen loperamide (IMODIUM A-D) 2 MG tablet Take 2 mg by mouth 4 (four) times daily as needed for diarrhea or loose stools.  . Probiotic Product (PROBIOTIC DAILY PO) Take by mouth.   No facility-administered encounter medications on file as of 09/08/2016.   :  Review of Systems:  Out of a complete 14 point review of systems, all are reviewed and negative with the exception of these symptoms as listed below:  Review of Systems  Neurological:       Pt presents today to discuss her sleep. Pt has never had a sleep study but does endorse snoring.  Epworth Sleepiness Scale 0= would never doze 1= slight chance of dozing 2= moderate chance  of dozing 3= high chance of dozing  Sitting and reading: 2 Watching TV: 2 Sitting inactive in a public place (ex. Theater or meeting): 1 As a passenger in a car for an hour without a break: 2 Lying down to rest in the afternoon: 1 Sitting and talking to someone: 0 Sitting quietly after lunch (no alcohol): 2 In a car, while stopped in traffic: 0 Total: 10      Objective:  Neurologic Exam  Physical Exam Physical Examination:   Vitals:   09/08/16 1622  BP: 139/78  Pulse: 80  Resp: 20   General Examination: The patient is a very pleasant 71 y.o. female in no acute distress. She appears well-developed and well-nourished and well groomed.    HEENT: Normocephalic, atraumatic, pupils are equal, round and reactive to light and accommodation. Extraocular tracking is good without limitation to gaze excursion or nystagmus noted. Normal smooth pursuit is noted. Hearing is grossly intact. Face is symmetric with normal facial animation and normal facial sensation. Speech is clear with no dysarthria noted. There is no hypophonia. There is no lip, neck/head, jaw or voice tremor. Neck is supple with full range of passive and active motion. There are no carotid bruits on auscultation. Oropharynx exam reveals: mild mouth dryness, adequate dental hygiene and mild airway crowding, due to smaller airway and redundant soft palate, tonsils in place. Mallampati is class II. Tongue protrudes centrally and palate elevates symmetrically. Tonsils are 1+ in size/absent. Neck size is 14.5 inches. She has a Mild overbite.    Chest: Clear to auscultation without wheezing, rhonchi or crackles noted.  Heart: S1+S2+0, regular and normal without murmurs, rubs or gallops noted.   Abdomen: Soft, non-tender and non-distended with normal bowel sounds appreciated on auscultation.  Extremities: There is no pitting edema in the distal lower extremities bilaterally. Pedal pulses are intact.  Skin: Warm and dry without  trophic changes noted.  Musculoskeletal: exam reveals no obvious joint deformities, tenderness or joint swelling or erythema.   Neurologically:  Mental status: The patient is awake, alert and oriented in all 4 spheres. Her immediate and remote memory, attention, language skills and fund of knowledge are appropriate. There is no evidence of aphasia, agnosia, apraxia or anomia. Speech is clear with normal prosody and enunciation. Thought process is linear. Mood is normal and affect is normal.  Cranial nerves II - XII are as described above under HEENT exam. In addition: shoulder shrug is normal with equal shoulder height noted. Motor exam: Normal bulk, strength and tone is noted. There is no drift, tremor or rebound. Romberg is negative. Reflexes are 1-2+ throughout. Fine motor skills and coordination: intact with normal finger taps, normal hand movements, normal rapid alternating patting, normal foot taps and normal foot agility.  Cerebellar  testing: No dysmetria or intention tremor on finger to nose testing. Heel to shin is unremarkable bilaterally. There is no truncal or gait ataxia.  Sensory exam: intact to light touch, vibration, temperature sense in the upper and lower extremities.  Gait, station and balance: She stands easily. No veering to one side is noted. No leaning to one side is noted. Posture is age-appropriate and stance is narrow based. Gait shows normal stride length and normal pace. No problems turning are noted. Tandem walk is unremarkable for age.  Assessment and Plan:  In summary, LATRELL REITAN is a very pleasant 71 y.o.-year old female with an underlying medical history of osteoporosis, plantar fasciitis, lactose intolerance, IBS, cataracts, hypertension, vitamin D deficiency and obesity, whose history and physical exam are concerning for obstructive sleep apnea (OSA). I had a long chat with the patient about my findings and the diagnosis of OSA, its prognosis and treatment  options. We talked about medical treatments, surgical interventions and non-pharmacological approaches. I explained in particular the risks and ramifications of untreated moderate to severe OSA, especially with respect to developing cardiovascular disease down the Road, including congestive heart failure, difficult to treat hypertension, cardiac arrhythmias, or stroke. Even type 2 diabetes has, in part, been linked to untreated OSA. Symptoms of untreated OSA include daytime sleepiness, memory problems, mood irritability and mood disorder such as depression and anxiety, lack of energy, as well as recurrent headaches, especially morning headaches. We talked about trying to maintain a healthy lifestyle in general, as well as the importance of weight control. I encouraged the patient to eat healthy, exercise daily and keep well hydrated, to keep a scheduled bedtime and wake time routine, to not skip any meals and eat healthy snacks in between meals. I advised the patient not to drive when feeling sleepy. I recommended the following at this time: sleep study with potential positive airway pressure titration. (We will score hypopneas at 4%).   I explained the sleep test procedure to the patient and also outlined possible surgical and non-surgical treatment options of OSA, including the use of a custom-made dental device (which would require a referral to a specialist dentist or oral surgeon), upper airway surgical options, such as pillar implants, radiofrequency surgery, tongue base surgery, and UPPP (which would involve a referral to an ENT surgeon). Rarely, jaw surgery such as mandibular advancement may be considered.  I also explained the CPAP treatment option to the patient, who indicated that she would be willing to try CPAP if the need arises. I explained the importance of being compliant with PAP treatment, not only for insurance purposes but primarily to improve Her symptoms, and for the patient's long term  health benefit, including to reduce Her cardiovascular risks. I answered all her questions today and the patient was in agreement. I would like to see her back after the sleep study is completed and encouraged her to call with any interim questions, concerns, problems or updates.   Thank you very much for allowing me to participate in the care of this nice patient. If I can be of any further assistance to you please do not hesitate to call me at [phone].  Sincerely,   Huston Foley, MD, PhD

## 2016-09-08 NOTE — Patient Instructions (Signed)

## 2016-09-19 DIAGNOSIS — Z0100 Encounter for examination of eyes and vision without abnormal findings: Secondary | ICD-10-CM | POA: Diagnosis not present

## 2016-09-20 DIAGNOSIS — Z1212 Encounter for screening for malignant neoplasm of rectum: Secondary | ICD-10-CM | POA: Diagnosis not present

## 2016-09-27 ENCOUNTER — Other Ambulatory Visit: Payer: Self-pay | Admitting: Internal Medicine

## 2016-09-27 DIAGNOSIS — Z1231 Encounter for screening mammogram for malignant neoplasm of breast: Secondary | ICD-10-CM

## 2016-10-06 ENCOUNTER — Telehealth: Payer: Self-pay | Admitting: Neurology

## 2016-10-06 DIAGNOSIS — E669 Obesity, unspecified: Secondary | ICD-10-CM

## 2016-10-06 DIAGNOSIS — R0681 Apnea, not elsewhere classified: Secondary | ICD-10-CM

## 2016-10-06 DIAGNOSIS — M810 Age-related osteoporosis without current pathological fracture: Secondary | ICD-10-CM | POA: Diagnosis not present

## 2016-10-06 DIAGNOSIS — R0683 Snoring: Secondary | ICD-10-CM

## 2016-10-06 DIAGNOSIS — R400 Somnolence: Secondary | ICD-10-CM

## 2016-10-06 NOTE — Telephone Encounter (Signed)
[employer] denied split but approved HST.  Can I get an order for HST? °

## 2016-10-06 NOTE — Telephone Encounter (Signed)
Order is in.

## 2016-10-12 ENCOUNTER — Encounter: Payer: Self-pay | Admitting: Internal Medicine

## 2016-10-17 ENCOUNTER — Ambulatory Visit (INDEPENDENT_AMBULATORY_CARE_PROVIDER_SITE_OTHER): Payer: Medicare HMO | Admitting: Neurology

## 2016-10-17 DIAGNOSIS — G4733 Obstructive sleep apnea (adult) (pediatric): Secondary | ICD-10-CM | POA: Diagnosis not present

## 2016-10-25 NOTE — Procedures (Signed)
  [facility] [address] [place], [place] [id number] NAME: Laura SoberVivian Siegel DOB: 07/11/1945 MEDICAL RECORD [id number] DOS: 10/17/16 REFERRING PHYSICIAN: Cletis AthensScott Holwerda,MD Study performed: HST HISTORY:  71 year old woman with a history of osteoporosis, plantar fasciitis, lactose intolerance, IBS, cataracts, hypertension, vitamin D deficiency and obesity, who reports snoring and excessive daytime somnolence. Epworth sleepiness score is 10 out of 24, fatigue score is 12 out of 63. BMI of 32.4. STUDY RESULTS: Total Recording Time: 8h 5311m Total Apnea/Hypopnea Index (AHI): 26.9/hour Average Oxygen Saturation: 95% Lowest Oxygen Saturation: 77% Average Heart Rate: 72 bpm  IMPRESSION: Obstructive Sleep Apnea OSA RECOMMENDATION: This home sleep test demonstrates overall moderate obstructive sleep apnea with a total AHI of 26.9/hour and O2 nadir of 77%. Given the patient's medical history and sleep related complaints, treatment with positive airway pressure (in the form of CPAP) is recommended. This will require a full night CPAP titration study for proper treatment settings and mask fitting. Please note that untreated obstructive sleep apnea carries additional perioperative morbidity. Patients with significant obstructive sleep apnea should receive perioperative PAP therapy and the surgeons and particularly the anesthesiologist should be informed of the diagnosis and the severity of the sleep disordered breathing. The patient should be cautioned not to drive, work at heights, or operate dangerous or heavy equipment when tired or sleepy. Review and reiteration of good sleep hygiene measures should be pursued with any patient. The patient and his referring provider will be notified of the test results. The [facility]  I certify that I have reviewed the raw data recording prior to the issuance of this report in accordance with  the standards of Accreditation of the American Academy of Sleep medicine (AASM) Huston FoleySaima Cedric Mcclaine, MD, PhD Diplomat, American Board of Psychiatry and Neurology (Neurology and Sleep Medicine)

## 2016-10-25 NOTE — Progress Notes (Signed)
Patient referred by Dr. Link SnufferHolwerda, seen by me on 09/08/16, HST on 10/17/16:  Please call and notify the patient that the recent home sleep test did suggest the diagnosis of moderate obstructive sleep apnea and that I recommend treatment for this in the form of CPAP. I will request an overnight sleep study for proper titration and mask fitting. Please explain to patient and arrange for a CPAP titration study. I have placed an order in the chart. Thanks, and [facility].   Huston FoleySaima Riki Gehring, MD, PhD [facility])

## 2016-10-25 NOTE — Addendum Note (Signed)
Addended by: Huston FoleyATHAR, Jillana Selph on: 10/25/2016 06:45 PM   Modules accepted: Orders

## 2016-10-27 ENCOUNTER — Telehealth: Payer: Self-pay

## 2016-10-27 NOTE — Telephone Encounter (Signed)
-----   Message from Huston FoleySaima Athar, MD sent at 10/25/2016  6:45 PM EDT ----- Patient referred by Dr. Link SnufferHolwerda, seen by me on 09/08/16, HST on 10/17/16:  Please call and notify the patient that the recent home sleep test did suggest the diagnosis of moderate obstructive sleep apnea and that I recommend treatment for this in the form of CPAP. I will request an overnight sleep study for proper titration and mask fitting. Please explain to patient and arrange for a CPAP titration study. I have placed an order in the chart. Thanks, and [facility].   Huston FoleySaima Athar, MD, PhD [facility])

## 2016-10-27 NOTE — Telephone Encounter (Signed)
Patient called back. She is aware of results and and recommendations. She is willing to proceed with titration study. I have sent copy of report to PCP.

## 2016-10-27 NOTE — Telephone Encounter (Signed)
LM on home and cell phone for patient to call back for results.

## 2016-11-08 ENCOUNTER — Ambulatory Visit
Admission: RE | Admit: 2016-11-08 | Discharge: 2016-11-08 | Disposition: A | Discharge status: Home / Self Care | Payer: Commercial Managed Care - HMO | Source: Ambulatory Visit | Attending: Internal Medicine | Admitting: Internal Medicine

## 2016-11-08 DIAGNOSIS — Z1231 Encounter for screening mammogram for malignant neoplasm of breast: Secondary | ICD-10-CM | POA: Diagnosis not present

## 2016-11-29 ENCOUNTER — Encounter (INDEPENDENT_AMBULATORY_CARE_PROVIDER_SITE_OTHER): Payer: Self-pay

## 2016-11-29 ENCOUNTER — Encounter: Payer: Self-pay | Admitting: Internal Medicine

## 2016-11-29 ENCOUNTER — Ambulatory Visit (INDEPENDENT_AMBULATORY_CARE_PROVIDER_SITE_OTHER): Payer: Commercial Managed Care - HMO | Admitting: Internal Medicine

## 2016-11-29 VITALS — BP 138/80 | HR 80 | Ht 63.5 in | Wt 176.2 lb

## 2016-11-29 DIAGNOSIS — K580 Irritable bowel syndrome with diarrhea: Secondary | ICD-10-CM | POA: Diagnosis not present

## 2016-11-29 NOTE — Progress Notes (Signed)
Laura Robles 70 y.o. 05-15-1946 [id number] Encounter Diagnosis  Name Primary?  . Irritable bowel syndrome with diarrhea Yes    Assessment & Plan:   1. IBS-D - Lond[place], chronic problem for the past 9-10 years. Colonoscopy from 2009 was normal and included biopsies, which were negative. Seems relatively well controlled with 1-2 imodium a day and avoiding trigger foods like dairy. Though it is safe for her to take imodium and dicyclomine together, it seems that the dicyclomine is not helping so instructed her to discontinue. Discussed the potential benefit of probiotics, and provided her with 1 month of samples of VSL# 3. Insrtucted her to call or send a message on MyChart with questions or concerns. Follow up for colonoscopy next year.   I have personally seen the patient, reviewed and repeated key elements of the history and physical and participated in formation of the assessment and plan the student has documented. IBS-D - managed well by 1-2 imodium a day. Does not need dicyclomine. Trial of probiotic also.  Iva Booparl E. Gessner, MD, Clementeen GrahamFACG' Cc:Alysia PennaHolwerda, Scott, MD   Subjective:   Chief Complaint: IBS-D, questions about medication  HPI Laura Robles is a very nice 71 year old woman who's had IBS-D and lactose intolerance for the past 9-10 years, relatively well controlled with 1-2 imodium per day. She is here today with questions about whether or not she can take imodium and dicyclomine at the same time, and whether there is anything else she can do to further improve her management of the problem. She's had a prescription for dicyclomine 20mg  for many years, but was told by a pharmacist not to take it at the same time as imodium. As a result she's stopped taking it, and doesn't feel it was helping her anyway. She says she has taken a round of probiotics in the past which she thinks helped her.   She usually has 1-2 loose bowel movements a day, not bloody or greasy, and generally  not associated with abdominal pain or nausea. If she eats at a restaurant or eats dairy she will have multiple bowel movements a day, and takes an extra imodium to control. Reports that she "almost never" wakes up at night with diarrhea. She reports a stable weight but our records show that she's lost 7 lbs over the past 3 months.   She is also asking about some "bug bites" she got from camping a few weeks ago on her right side near her breast. They are itchy and haven't gone away over the weeks.   Wt Readings from Last 3 Encounters:  11/29/16 176 lb 4 oz (79.9 kg)  09/08/16 183 lb (83 kg)  03/19/09 173 lb 6.1 oz (78.6 kg)    Allergies  Allergen Reactions  . Ibuprofen   . Penicillins    Current Meds  Medication Sig  . lisinopril-hydrochlorothiazide (PRINZIDE,ZESTORETIC) 10-12.5 MG tablet Take 1 tablet by mouth daily.  Marland Kitchen. loperamide (IMODIUM A-D) 2 MG tablet Take 2 mg by mouth 4 (four) times daily as needed for diarrhea or loose stools.  . Probiotic Product (VSL#3 DS) PACK Take by mouth. 1 pack daily  . [DISCONTINUED] Probiotic Product (PROBIOTIC DAILY PO) Take by mouth.   Past Medical History:  Diagnosis Date  . Cataracts, bilateral   . Ectopic pregnancy   . Hypertension   . IBS (irritable bowel syndrome)   . Lactose intolerance   . Osteoporosis   . Plantar fasciitis   . Vitamin D deficiency  Past Surgical History:  Procedure Laterality Date  . ABDOMINAL HYSTERECTOMY    . BREAST BIOPSY Left 03/31/1997   benign  . CHOLECYSTECTOMY  2008  . COLONOSCOPY  2009   negative screening  . TUBAL LIGATION     Social History   Social History  . Marital status: Single    Spouse name: N/A  . Number of children: 2   Occupational History  . retired    Social History Main Topics  . Smoking status: Former Smoker    Types: Cigarettes    Quit date: 05/31/1983  . Smokeless tobacco: Never Used  . Alcohol use No  . Drug use: No    family history includes Diabetes in her son;  Heart attack in her father; Stroke in her father and paternal grandmother.   Review of Systems Pertinent noted in HPI. Otherwise negative.   Objective:   Physical Exam @BP  138/80 (BP Location: Left Arm, Patient Position: Sitting, Cuff Size: Normal)   Pulse 80   Ht 5' 3.5" (1.613 m) Comment: height measured without shoes  Wt 176 lb 4 oz (79.9 kg)   BMI 30.73 kg/m @  General:  Calm, attentive. Over weight. In no acute distress Skin:  About 5 papules on right lateral trunk, near breast. Not erythematous. Do not look infected.  Eyes:  Anicteric.Cataracts visible.  ENT:   Mouth and posterior pharynx free of lesions.  Neck:   supple w/o thyromegaly or mass.  Lungs: Clear to auscultation bilaterally. Heart:  S1S2, no rubs, murmurs, gallops. Abdomen:  soft, non-tender, no hepatosplenomegaly, hernia, or mass and BS+.  Rectal: Not done. Lymph:  no cervical or supraclavicular adenopathy. Extremities:   no edema, cyanosis or clubbing Skin   no rash. Neuro:  A&O x 3.  Psych:  appropriate mood and  Affect.   Data Reviewed: PCP notes, labs, prior endoscopy reports reviewed

## 2016-11-29 NOTE — Patient Instructions (Signed)
  Today we are providing you with samples of VSL #3 DS, take one packet daily.  If this helps you can purchase the lower strength over the counter to take.  If you have questions call us back.  Stop your dicyclomine per Dr Leone PayorGessner.  Continue your Imodium per Dr Leone PayorGessner.   I appreciate the opportunity to care for you. Stan Headarl Gessner, MD, [facility]AC[facility]

## 2016-11-30 ENCOUNTER — Encounter: Payer: Self-pay | Admitting: Internal Medicine

## 2016-12-01 ENCOUNTER — Ambulatory Visit (INDEPENDENT_AMBULATORY_CARE_PROVIDER_SITE_OTHER): Payer: Medicare HMO | Admitting: Neurology

## 2016-12-01 DIAGNOSIS — G4733 Obstructive sleep apnea (adult) (pediatric): Secondary | ICD-10-CM

## 2016-12-06 NOTE — Addendum Note (Signed)
Addended by: Huston FoleyATHAR, Zolton Dowson on: 12/06/2016 08:39 AM   Modules accepted: Orders

## 2016-12-06 NOTE — Procedures (Signed)
PATIENT'S NAME:  Laura Robles, Laura Robles DOB:      1945/06/09      MR#:    [id number]     DATE OF RECORDING: 12/01/2016 REFERRING M.D.:  Alysia PennaScott Holwerda, MD Study Performed:   CPAP  Titration HISTORY:  71 year old right-handed woman with an underlying medical history of osteoporosis, plantar fasciitis, lactose intolerance, IBS, cataracts, hypertension, vitamin D deficiency and obesity, who presents for a CPAP titration. A Home sleep test performed on 10/17/2016 demonstrated overall moderate obstructive sleep apnea with a total AHI of 26.9/hour and O2 nadir of 77%. The patient endorsed the Epworth Sleepiness Scale at 10 points and the Fatigue Score at 12 points. The patient's weight 183 pounds with a height of 63 (inches), resulting in a BMI of 32.4 kg/m2. The patient's neck circumference measured 14.5 inches.  CURRENT MEDICATIONS: Fosamax, Bentyl, Prinzide, Imodium A-D, Probiotic  PROCEDURE:  This is a multichannel digital polysomnogram utilizing the SomnoStar 11.2 system.  Electrodes and sensors were applied and monitored per AASM Specifications.   EEG, EOG, Chin and Limb EMG, were sampled at 200 Hz.  ECG, Snore and Nasal Pressure, Thermal Airflow, Respiratory Effort, CPAP Flow and Pressure, Oximetry was sampled at 50 Hz. Digital video and audio were recorded.      The patient was fitted with a medium Eson 2 nasal mask. CPAP was initiated at 5 cmH20 with heated humidity per AASM standards and pressure was advanced to 9 cmH20 because of hypopneas, apneas and desaturations.  At a PAP pressure of 9 cmH20, there was a reduction of the AHI to 0/hour, with supine REM sleep achieved, O2 nadir of 95%.    Lights Out was at 22:22 and Lights On at 04:59. Total recording time (TRT) was 398 minutes, with a total sleep time (TST) of 319 minutes. The patient's sleep latency was 4 minutes. REM latency was 64.5 minutes, which is borderline reduced. The sleep efficiency was 80.2 %.    SLEEP ARCHITECTURE: WASO (Wake after sleep  onset) was elevated, with inability to return to sleep after 03:56 AM. There were 3 minutes in Stage N1, 149 minutes Stage N2, 102.5 minutes Stage N3 and 64.5 minutes in Stage REM.  The percentage of Stage N1 was .9%, Stage N2 was 46.7%, which is normal, Stage N3 was 32.1%, which is increased, and Stage R (REM sleep) was 20.2%, which is normal.  The arousals were noted as: 15 were spontaneous, 0 were associated with PLMs, 0 were associated with respiratory events.  Audio and video analysis did not show any abnormal or unusual movements, behaviors, phonations or vocalizations.  The patient took 1 bathroom break. The EKG was in keeping with normal sinus rhythm (NSR).  RESPIRATORY ANALYSIS:  There was a total of 7 respiratory events: 6 obstructive apneas, 0 central apneas and 0 mixed apneas with a total of 6 apneas and an apnea index (AI) of 1.1 /hour. There were 1 hypopneas with a hypopnea index of .2/hour. The patient also had 0 respiratory event related arousals (RERAs).      The total APNEA/HYPOPNEA INDEX  (AHI) was 1.3 /hour and the total RESPIRATORY DISTURBANCE INDEX was 1.3 .hour  5 events occurred in REM sleep and 2 events in NREM. The REM AHI was 4.7 /hour versus a non-REM AHI of .5 /hour.  The patient spent 319 minutes of total sleep time in the supine position and 0 minutes in non-supine. The supine AHI was 1.3, versus a non-supine AHI of 0.0.  OXYGEN SATURATION & C02:  The  baseline 02 saturation was 98%, with the lowest being 87%. Time spent below 89% saturation equaled 1 minutes.  PERIODIC LIMB MOVEMENTS: The patient had a total of 0 Periodic Limb Movements. The Periodic Limb Movement (PLM) index was 0 and the PLM Arousal index was 0 /hour.  Post-study, the patient indicated that sleep was better than usual.   DIAGNOSIS  1. Obstructive Sleep Apnea   PLANS/RECOMMENDATIONS:  1. This study demonstrates resolution of the patient's obstructive sleep apnea with CPAP therapy. I will,  therefore, start the patient on home CPAP treatment at a pressure of 9 cm via medium nasal mask with heated humidity. The patient should be reminded to be fully compliant with PAP therapy to improve sleep related symptoms and decrease long term cardiovascular risks. The patient should be reminded, that it may take up to 3 months to get fully used to using PAP with all planned sleep. The earlier full compliance is achieved, the better long term compliance tends to be. Please note that untreated obstructive sleep apnea carries additional perioperative morbidity. Patients with significant obstructive sleep apnea should receive perioperative PAP therapy and the surgeons and particularly the anesthesiologist should be informed of the diagnosis and the severity of the sleep disordered breathing. 2. The patient should be cautioned not to drive, work at heights, or operate dangerous or heavy equipment when tired or sleepy. Review and reiteration of good sleep hygiene measures should be pursued with any patient. 3. The patient will be seen in follow-up by Dr. Frances Furbish at [facility] for discussion of the test results and further management strategies. The referring provider will be notified of the test results.  I certify that I have reviewed the entire raw data recording prior to the issuance of this report in accordance with the Standards of Accreditation of the American Academy of Sleep Medicine (AASM)   Huston Foley, MD, PhD Diplomat, American Board of Psychiatry and Neurology (Neurology and Sleep Medicine)

## 2016-12-06 NOTE — Progress Notes (Signed)
Patient referred by Dr. Link SnufferHolwerda, seen by me on 09/08/16, HST on 10/17/16, CPAP titration on 12/01/16:  Please call and inform patient that I have entered an order for treatment with positive airway pressure (PAP) treatment of obstructive sleep apnea (OSA). She did well during the latest sleep study with CPAP. We will, therefore, arrange for a machine for home use through a DME (durable medical equipment) company of Her choice; and I will see the patient back in follow-up in about 10 weeks. Please also explain to the patient that I will be looking out for compliance data, which can be downloaded from the machine (stored on an SD card, that is inserted in the machine) or via remote access through a modem, that is built into the machine. At the time of the followup appointment we will discuss sleep study results and how it is going with PAP treatment at home. Please advise patient to bring Her machine at the time of the first FU visit, even though this is cumbersome. Bringing the machine for every visit after that will likely not be needed, but often helps for the first visit to troubleshoot if needed. Please re-enforce the importance of compliance with treatment and the need for us to monitor compliance data - often an insurance requirement and actually good feedback for the patient as far as how they are doing.  Also remind patient, that any interim PAP machine or mask issues should be first addressed with the DME company, as they can often help better with technical and mask fit issues. Please ask if patient has a preference regarding DME company.  Please also make sure, the patient has a follow-up appointment with me or Eber JonesCarolyn or Aundra MilletMegan in about 10 weeks from the setup date, thanks.  Once you have spoken to the patient - and faxed/routed report to PCP and referring MD (if other than PCP), you can close this encounter, thanks,   Huston FoleySaima Latajah Thuman, MD, PhD [facility] ([facility]

## 2016-12-07 ENCOUNTER — Telehealth: Payer: Self-pay

## 2016-12-07 NOTE — Telephone Encounter (Signed)
-----   Message from Huston FoleySaima Athar, MD sent at 12/06/2016  8:39 AM EDT ----- Patient referred by Dr. Link SnufferHolwerda, seen by me on 09/08/16, HST on 10/17/16, CPAP titration on 12/01/16:  Please call and inform patient that I have entered an order for treatment with positive airway pressure (PAP) treatment of obstructive sleep apnea (OSA). She did well during the latest sleep study with CPAP. We will, therefore, arrange for a machine for home use through a DME (durable medical equipment) company of Her choice; and I will see the patient back in follow-up in about 10 weeks. Please also explain to the patient that I will be looking out for compliance data, which can be downloaded from the machine (stored on an SD card, that is inserted in the machine) or via remote access through a modem, that is built into the machine. At the time of the followup appointment we will discuss sleep study results and how it is going with PAP treatment at home. Please advise patient to bring Her machine at the time of the first FU visit, even though this is cumbersome. Bringing the machine for every visit after that will likely not be needed, but often helps for the first visit to troubleshoot if needed. Please re-enforce the importance of compliance with treatment and the need for us to monitor compliance data - often an insurance requirement and actually good feedback for the patient as far as how they are doing.  Also remind patient, that any interim PAP machine or mask issues should be first addressed with the DME company, as they can often help better with technical and mask fit issues. Please ask if patient has a preference regarding DME company.  Please also make sure, the patient has a follow-up appointment with me or Eber JonesCarolyn or Aundra MilletMegan in about 10 weeks from the setup date, thanks.  Once you have spoken to the patient - and faxed/routed report to PCP and referring MD (if other than PCP), you can close this encounter, thanks,   Huston FoleySaima  Athar, MD, PhD [facility] ([facility]

## 2016-12-07 NOTE — Telephone Encounter (Signed)
I called pt. I advised pt that Dr. Frances FurbishAthar reviewed their sleep study results and found that pt did well during the latest sleep study with the cpap. Dr. Frances FurbishAthar recommends that pt start a cpap at home. I reviewed PAP compliance expectations with the pt. Pt is agreeable to starting a CPAP. I advised pt that an order will be sent to a DME, [facility], and [facility] will call the pt within about one week after they file with the pt's insurance. [facility] will show the pt how to use the machine, fit for masks, and troubleshoot the CPAP if needed. A follow up appt was made for insurance purposes with Dr. Frances FurbishAthar on Tuesday, September 25th, 2018 at 1:00pm. Pt verbalized understanding to arrive 15 minutes early and bring their CPAP. A letter with all of this information in it will be mailed to the pt as a reminder. I verified with the pt that the address we have on file is correct. Pt verbalized understanding of results. Pt had no questions at this time but was encouraged to call back if questions arise.

## 2016-12-28 DIAGNOSIS — G4733 Obstructive sleep apnea (adult) (pediatric): Secondary | ICD-10-CM | POA: Diagnosis not present

## 2017-01-28 DIAGNOSIS — G4733 Obstructive sleep apnea (adult) (pediatric): Secondary | ICD-10-CM | POA: Diagnosis not present

## 2017-02-14 ENCOUNTER — Ambulatory Visit: Payer: Self-pay | Admitting: Neurology

## 2017-02-17 ENCOUNTER — Encounter: Payer: Self-pay | Admitting: Neurology

## 2017-02-21 ENCOUNTER — Encounter: Payer: Self-pay | Admitting: Neurology

## 2017-02-21 ENCOUNTER — Ambulatory Visit (INDEPENDENT_AMBULATORY_CARE_PROVIDER_SITE_OTHER): Payer: Medicare HMO | Admitting: Neurology

## 2017-02-21 VITALS — BP 125/64 | HR 86 | Ht 64.5 in | Wt 180.0 lb

## 2017-02-21 DIAGNOSIS — G4733 Obstructive sleep apnea (adult) (pediatric): Secondary | ICD-10-CM

## 2017-02-21 DIAGNOSIS — Z9989 Dependence on other enabling machines and devices: Secondary | ICD-10-CM

## 2017-02-21 NOTE — Progress Notes (Signed)
Subjective:    Patient ID: Laura Robles is a 71 y.o. female.  HPI     Interim history:  Ms. Mccleery is a 71 year old right-handed woman with an underlying medical history of osteoporosis, plantar fasciitis, lactose intolerance, IBS, cataracts, hypertension, vitamin D deficiency and obesity, who presents for follow-up consultation of her obstructive sleep apnea, after recent sleep testing. The patient is unaccompanied today. I first met her on 09/08/2016 at the request of her primary care physician, at which time she reported snoring and excessive daytime somnolence. I suggested we proceed with sleep study testing. Her insurance denied and attended sleep study. She had a home sleep test on 10/17/2016 which showed an AHI of 26.9 per hour, O2 nadir of 77%. Based on her symptoms and test results indicating at least moderate obstructive sleep apnea, I suggested we proceed with an attended CPAP titration. She had a CPAP titration study on 12/01/2016. Sleep efficiency was 80.2%, sleep latency 4 minutes, REM latency mildly reduced at 64.5 minutes, she achieved an increased percentage of slow-wave sleep and REM sleep was normal at 20.2%. She was fitted with a nasal mask and CPAP was titrated from 5 cm to 9 cm. On the final pressure her AHI was 0 per hour with supine REM sleep achieved an O2 nadir of 95%. Based on her test results I prescribed CPAP therapy for home use.  Today, 02/21/2017 (all dictated new, as well as above notes, some dictation done in note pad or Word, outside of chart, may appear as copied):   I reviewed her CPAP compliance data from 01/19/2017 through 02/17/2017, which is a total of 30 days, during which time she used her CPAP 27 days with percent used days greater than 4 hours of 90%, indicating excellent compliance with an average usage of 7 hours and 56 minutes, residual AHI 1.1 per hour, leak low with the 95th percentile at 3.2 L/m on a pressure of 9 cm with EPR of 2. She reports  doing well with CPAP. She feels improved in that her sleep is more consolidated and more restful. She is on as needed Imodium basis, nearly daily. She is using an Airfit N20 medium nasal mask.   The patient's allergies, current medications, family history, past medical history, past social history, past surgical history and problem list were reviewed and updated as appropriate.   Previously (copied from previous notes for reference):   09/08/16: (She) reports snoring and excessive daytime somnolence.  I reviewed your office note from 09/06/2016, which you kindly included. The patient is single, she lives alone. She quit smoking in 1985, she quit drinking alcohol in 1990, does not drink caffeine on a daily basis. 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 She has 2 grown sons, 7 grandchildren, one great-grandchild and one great grandchild on the way. She does not have a set bedtime and wake time. She watches TV in bed at times. Bedtime may vary from 10 PM to 2 AM. Wake up time varies from 7 AM to 10 AM. She does not have night to night nocturia or recurrent morning headaches. She has she had a bedroom before with her friend on a trip out of town and was told that she has loud snoring and also pauses in her breathing. Her grandchildren also reports for snoring to her. She does not always wake up rested. Epworth sleepiness score is 10 out of 24, fatigue score is 12 out of 63. She denies restless leg symptoms but has leg aching at times. She  is not aware of any family history of OSA.  Her Past Medical History Is Significant For: Past Medical History:  Diagnosis Date  . Cataracts, bilateral   . Ectopic pregnancy   . Hypertension   . IBS (irritable bowel syndrome)   . Lactose intolerance   . Osteoporosis   . Plantar fasciitis   . Vitamin D deficiency     Her Past Surgical History Is Significant For: Past Surgical History:  Procedure Laterality Date  . ABDOMINAL HYSTERECTOMY    . BREAST BIOPSY Left 03/31/1997   benign   . CHOLECYSTECTOMY  2008  . COLONOSCOPY  2009   negative screening  . TUBAL LIGATION      Her Family History Is Significant For: Family History  Problem Relation Age of Onset  . Heart attack Father   . Stroke Father   . Diabetes Son   . Stroke Paternal Grandmother     Her Social History Is Significant For: Social History   Social History  . Marital status: Single    Spouse name: N/A  . Number of children: 2  . Years of education: N/A   Occupational History  . retired    Social History Main Topics  . Smoking status: Former Smoker    Types: Cigarettes    Quit date: 05/31/1983  . Smokeless tobacco: Never Used  . Alcohol use No  . Drug use: No  . Sexual activity: Not Asked   Other Topics Concern  . None   Social History Narrative  . None    Her Allergies Are:  Allergies  Allergen Reactions  . Ibuprofen   . Penicillins   :   Her Current Medications Are:  Outpatient Encounter Prescriptions as of 02/21/2017  Medication Sig  . lisinopril-hydrochlorothiazide (PRINZIDE,ZESTORETIC) 10-12.5 MG tablet Take 1 tablet by mouth daily.  Marland Kitchen loperamide (IMODIUM A-D) 2 MG tablet Take 2 mg by mouth 4 (four) times daily as needed for diarrhea or loose stools.  . Probiotic Product (VSL#3 DS) PACK Take by mouth. 1 pack daily   No facility-administered encounter medications on file as of 02/21/2017.   :  Review of Systems:  Out of a complete 14 point review of systems, all are reviewed and negative with the exception of these symptoms as listed below: Review of Systems  Neurological:       Pt presents today to follow up on her cpap. Pt reports that she is doing well on her cpap. Pt is using [facility].    Objective:  Neurological Exam  Physical Exam Physical Examination:   Vitals:   02/21/17 1307  BP: 125/64  Pulse: 86    General Examination: The patient is a very pleasant 71 y.o. female in no acute distress. She appears well-developed and well-nourished and well groomed.  Good spirits.   HEENT: Normocephalic, atraumatic, pupils are equal, round and reactive to light and accommodation. Extraocular tracking is good without limitation to gaze excursion or nystagmus noted. Normal smooth pursuit is noted. Hearing is grossly intact. Face is symmetric with normal facial animation and normal facial sensation. Speech is clear with no dysarthria noted. There is no hypophonia. There is no lip, neck/head, jaw or voice tremor. Neck is supple with full range of passive and active motion. There are no carotid bruits on auscultation. Oropharynx exam reveals: mild mouth dryness, adequate dental hygiene and mild airway crowding. Mallampati is class II. Tongue protrudes centrally and palate elevates symmetrically. Tonsils are 1+ in size/absent.   Chest: Clear to auscultation  without wheezing, rhonchi or crackles noted.  Heart: S1+S2+0, regular and normal without murmurs, rubs or gallops noted.   Abdomen: Soft, non-tender and non-distended with normal bowel sounds appreciated on auscultation.  Extremities: There is no pitting edema in the distal lower extremities bilaterally. Pedal pulses are intact.  Skin: Warm and dry without trophic changes noted.  Musculoskeletal: exam reveals no obvious joint deformities, tenderness or joint swelling or erythema.   Neurologically:  Mental status: The patient is awake, alert and oriented in all 4 spheres. Her immediate and remote memory, attention, language skills and fund of knowledge are appropriate. There is no evidence of aphasia, agnosia, apraxia or anomia. Speech is clear with normal prosody and enunciation. Thought process is linear. Mood is normal and affect is normal.  Cranial nerves II - XII are as described above under HEENT exam. In addition: shoulder shrug is normal with equal shoulder height noted. Motor exam: Normal bulk, strength and tone is noted. There is no drift, tremor or rebound. Romberg is negative. Reflexes are 1-2+  throughout. Fine motor skills and coordination: grossly intact.  Cerebellar testing: No dysmetria or intention tremor. There is no truncal or gait ataxia.  Sensory exam: intact to light touch.  Gait, station and balance: She stands easily. No veering to one side is noted. No leaning to one side is noted. Posture is age-appropriate and stance is narrow based. Gait shows normal stride length and normal pace. No problems turning are noted. Tandem walk is unremarkable for age.  Assessment and Plan:  In summary, CASSIE SHEDLOCK is a very pleasant 71 year old female with an underlying medical history of osteoporosis, plantar fasciitis, lactose intolerance, IBS, cataracts, hypertension, vitamin D deficiency and obesity, who presents for follow-up consultation of her obstructive sleep apnea, after recent sleep study testing. She had a home sleep test on 10/17/2016 followed by a CPAP titration study in house on 12/01/2016. She did well with CPAP therapy. She has established treatment at home with very good compliance and good results reported. She is commended for her treatment adherence. We talked about her home sleep test results indicating at least moderate obstructive sleep apnea and her CPAP titration study results, reviewed her compliance data together as well. Physical exam is stable. I explained the importance of being compliant with PAP treatment, not only for insurance purposes but primarily to improve Her symptoms, and for the patient's long term health benefit, including to reduce Her cardiovascular risks.  I suggested a 6 month follow-up, sooner as needed, with one of our NPs. I answered all her questions today and she was in agreement.  I spent 25 minutes in total face-to-face time with the patient, more than 50% of which was spent in counseling and coordination of care, reviewing test results, reviewing medication and discussing or reviewing the diagnosis of OSA, its prognosis and treatment options.  Pertinent laboratory and imaging test results that were available during this visit with the patient were reviewed by me and considered in my medical decision making (see chart for details).

## 2017-02-21 NOTE — Patient Instructions (Signed)
Please continue using your CPAP regularly. While your insurance requires that you use CPAP at least 4 hours each night on 70% of the nights, I recommend, that you not skip any nights and use it throughout the night if you can. Getting used to CPAP and staying with the treatment long term does take time and patience and discipline. Untreated obstructive sleep apnea when it is moderate to severe can have an adverse impact on cardiovascular health and raise her risk for heart disease, arrhythmias, hypertension, congestive heart failure, stroke and diabetes. Untreated obstructive sleep apnea causes sleep disruption, nonrestorative sleep, and sleep deprivation. This can have an impact on your day to day functioning and cause daytime sleepiness and impairment of cognitive function, memory loss, mood disturbance, and problems focussing. Using CPAP regularly can improve these symptoms.  Keep up the good work! We can see you in about 6 months, you can see one of our nurse practitioners as you are stable. I will see you after that.     

## 2017-02-27 DIAGNOSIS — G4733 Obstructive sleep apnea (adult) (pediatric): Secondary | ICD-10-CM | POA: Diagnosis not present

## 2017-03-30 DIAGNOSIS — G4733 Obstructive sleep apnea (adult) (pediatric): Secondary | ICD-10-CM | POA: Diagnosis not present

## 2017-04-03 DIAGNOSIS — G4733 Obstructive sleep apnea (adult) (pediatric): Secondary | ICD-10-CM | POA: Diagnosis not present

## 2017-04-29 DIAGNOSIS — G4733 Obstructive sleep apnea (adult) (pediatric): Secondary | ICD-10-CM | POA: Diagnosis not present

## 2017-05-16 DIAGNOSIS — I10 Essential (primary) hypertension: Secondary | ICD-10-CM | POA: Diagnosis not present

## 2017-05-16 DIAGNOSIS — H5210 Myopia, unspecified eye: Secondary | ICD-10-CM | POA: Diagnosis not present

## 2017-05-16 DIAGNOSIS — H2510 Age-related nuclear cataract, unspecified eye: Secondary | ICD-10-CM | POA: Diagnosis not present

## 2017-05-30 DIAGNOSIS — G4733 Obstructive sleep apnea (adult) (pediatric): Secondary | ICD-10-CM | POA: Diagnosis not present

## 2017-06-30 DIAGNOSIS — G4733 Obstructive sleep apnea (adult) (pediatric): Secondary | ICD-10-CM | POA: Diagnosis not present

## 2017-07-04 DIAGNOSIS — G4733 Obstructive sleep apnea (adult) (pediatric): Secondary | ICD-10-CM | POA: Diagnosis not present

## 2017-07-27 DIAGNOSIS — J0100 Acute maxillary sinusitis, unspecified: Secondary | ICD-10-CM | POA: Diagnosis not present

## 2017-07-27 DIAGNOSIS — R6883 Chills (without fever): Secondary | ICD-10-CM | POA: Diagnosis not present

## 2017-07-27 DIAGNOSIS — J3089 Other allergic rhinitis: Secondary | ICD-10-CM | POA: Diagnosis not present

## 2017-07-27 DIAGNOSIS — Z6832 Body mass index (BMI) 32.0-32.9, adult: Secondary | ICD-10-CM | POA: Diagnosis not present

## 2017-07-27 DIAGNOSIS — I10 Essential (primary) hypertension: Secondary | ICD-10-CM | POA: Diagnosis not present

## 2017-07-27 DIAGNOSIS — R05 Cough: Secondary | ICD-10-CM | POA: Diagnosis not present

## 2017-07-28 DIAGNOSIS — G4733 Obstructive sleep apnea (adult) (pediatric): Secondary | ICD-10-CM | POA: Diagnosis not present

## 2017-08-22 ENCOUNTER — Ambulatory Visit: Payer: Medicare HMO | Admitting: Adult Health

## 2017-08-22 ENCOUNTER — Encounter: Payer: Self-pay | Admitting: Adult Health

## 2017-08-22 VITALS — BP 140/78 | HR 88 | Wt 192.6 lb

## 2017-08-22 DIAGNOSIS — Z9989 Dependence on other enabling machines and devices: Secondary | ICD-10-CM | POA: Diagnosis not present

## 2017-08-22 DIAGNOSIS — G4733 Obstructive sleep apnea (adult) (pediatric): Secondary | ICD-10-CM | POA: Diagnosis not present

## 2017-08-22 NOTE — Patient Instructions (Signed)
Your Plan:  Continue using CPAP nightly [facility] If your symptoms worsen or you develop new symptoms please let us know.    [facility]. I hope we have been able to provide you high quality care today.  You may receive a patient satisfaction survey over the next few weeks. We would appreciate your feedback and comments so that we may continue to improve ourselves and the health of our patients.

## 2017-08-22 NOTE — Progress Notes (Addendum)
PATIENT: Laura Robles DOB: [birth year]  REASON FOR VISIT: follow up HISTORY FROM: patient  HISTORY OF PRESENT ILLNESS: Today 08/22/17 Laura Robles is a 72-year-old female with a history of her daily she use her machine 30 out of 30 days for compliance of 100%.  She use her machine greater than 4 hours each night.  On average she uses her machine 7 hours and 59 minutes.  Her residual AHI is 0.6 on 9 cm of water with EPR 3.  She does not had a significant leak.  Overall the patient is doing well.  She reports that she continues to get good benefit from using a CPAP.  She denies any significant daytime sleepiness or fatigue.  She returns today for evaluation.  HISTORY 02/21/2017 (Copied From Dr. Teofilo Pod Note)  I reviewed her CPAP compliance data from 01/19/2017 through 02/17/2017, which is a total of 30 days, during which time she used her CPAP 27 days with percent used days greater than 4 hours of 90%, indicating excellent compliance with an average usage of 7 hours and 56 minutes, residual AHI 1.1 per hour, leak low with the 95th percentile at 3.2 L/m on a pressure of 9 cm with EPR of 2. She reports doing well with CPAP. She feels improved in that her sleep is more consolidated and more restful. She is on as needed Imodium basis, nearly daily. She is using an Airfit N20 medium nasal mask.   The patient's allergies, current medications, family history, past medical history, past social history, past surgical history and problem list were reviewed and updated as appropriate    REVIEW OF SYSTEMS: Out of a complete 14 system review of symptoms, the patient complains only of the following symptoms, and all other reviewed systems are negative.  See HPI  ALLERGIES: Allergies  Allergen Reactions  . Ibuprofen   . Penicillins     HOME MEDICATIONS: Outpatient Medications Prior to Visit  Medication Sig Dispense Refill  . lisinopril-hydrochlorothiazide (PRINZIDE,ZESTORETIC) 10-12.5 MG  tablet Take 1 tablet by mouth daily.    Marland Kitchen loperamide (IMODIUM A-D) 2 MG tablet Take 2 mg by mouth 4 (four) times daily as needed for diarrhea or loose stools.    . Probiotic Product (VSL#3 DS) PACK Take by mouth. 1 pack daily     No facility-administered medications prior to visit.     PAST MEDICAL HISTORY: Past Medical History:  Diagnosis Date  . Cataracts, bilateral   . Ectopic pregnancy   . Hypertension   . IBS (irritable bowel syndrome)   . Lactose intolerance   . Osteoporosis   . Plantar fasciitis   . Vitamin D deficiency     PAST SURGICAL HISTORY: Past Surgical History:  Procedure Laterality Date  . ABDOMINAL HYSTERECTOMY    . BREAST BIOPSY Left 03/31/1997   benign  . CHOLECYSTECTOMY  2008  . COLONOSCOPY  2009   negative screening  . TUBAL LIGATION      FAMILY HISTORY: Family History  Problem Relation Age of Onset  . Heart attack Father   . Stroke Father   . Diabetes Son   . Stroke Paternal Grandmother     SOCIAL HISTORY: Social History   Socioeconomic History  . Marital status: Single    Spouse name: Not on file  . Number of children: 2  . Years of education: Not on file  . Highest education level: Not on file  Occupational History  . Occupation: retired  Engineer, production  . Physicist, medical  strain: Not on file  . Food insecurity:    Worry: Not on file    Inability: Not on file  . Transportation needs:    Medical: Not on file    Non-medical: Not on file  Tobacco Use  . Smoking status: Former Smoker    Types: Cigarettes    Last attempt to quit: 05/31/1983    Years since quitting: 34.2  . Smokeless tobacco: Never Used  Substance and Sexual Activity  . Alcohol use: No  . Drug use: No  . Sexual activity: Not on file  Lifestyle  . Physical activity:    Days per week: Not on file    Minutes per session: Not on file  . Stress: Not on file  Relationships  . Social connections:    Talks on phone: Not on file    Gets together: Not on file     Attends religious service: Not on file    Active member of club or organization: Not on file    Attends meetings of clubs or organizations: Not on file    Relationship status: Not on file  . Intimate partner violence:    Fear of current or ex partner: Not on file    Emotionally abused: Not on file    Physically abused: Not on file    Forced sexual activity: Not on file  Other Topics Concern  . Not on file  Social History Narrative  . Not on file      PHYSICAL EXAM  Vitals:   08/22/17 1117  BP: 140/78  Pulse: 88  Weight: 192 lb 9.6 oz (87.4 kg)   Body mass index is 32.55 kg/m.  Generalized: Well developed, in no acute distress   Neurological examination  Mentation: Alert oriented to time, place, history taking. Follows all commands speech and language fluent Cranial nerve II-XII: Pupils were equal round reactive to light. Extraocular movements were full, visual field were full on confrontational test. Facial sensation and strength were normal. Uvula tongue midline. Head turning and shoulder shrug  were normal and symmetric. Motor: The motor testing reveals 5 over 5 strength of all 4 extremities. Good symmetric motor tone is noted throughout.  Sensory: Sensory testing is intact to soft touch on all 4 extremities. No evidence of extinction is noted.  Coordination: Cerebellar testing reveals good finger-nose-finger and heel-to-shin bilaterally.  Gait and station: Gait is normal.  Reflexes: Deep tendon reflexes are symmetric and normal bilaterally.   DIAGNOSTIC DATA (LABS, IMAGING, TESTING) - I reviewed patient records, labs, notes, testing and imaging myself where available.  Lab Results  Component Value Date   WBC 4.1 08/26/2009   HGB 15.3 (H) 08/26/2009   HCT 44.7 08/26/2009   MCV 80.5 08/26/2009   PLT 292 08/26/2009      Component Value Date/Time   NA 140 08/06/2007 2138   K 4.2 08/06/2007 2138   CL 100 08/06/2007 2138   CO2 27 08/06/2007 2138   GLUCOSE 94  08/06/2007 2138   BUN 8 08/06/2007 2138   CREATININE 0.72 08/06/2007 2138   CALCIUM 9.0 08/06/2007 2138   PROT 7.2 08/06/2007 2138   ALBUMIN 4.2 08/06/2007 2138   AST 18 08/06/2007 2138   ALT 20 08/06/2007 2138   ALKPHOS 68 08/06/2007 2138   BILITOT 0.5 08/06/2007 2138   Lab Results  Component Value Date   CHOL 136 08/26/2009   HDL 54 08/26/2009   LDLCALC 50 08/26/2009   TRIG 161 (H) 08/26/2009   CHOLHDL 2.5  Ratio 08/26/2009   No results found for: HGBA1C No results found for: VITAMINB12 No results found for: TSH    ASSESSMENT AND PLAN 72 y.o. year old female  has a past medical history of Cataracts, bilateral, Ectopic pregnancy, Hypertension, IBS (irritable bowel syndrome), Lactose intolerance, Osteoporosis, Plantar fasciitis, and Vitamin D deficiency. here with:  1.  Obstructive sleep apnea on CPAP  The patient CPAP download shows compliance and good treatment of apnea.  She is encouraged to continue using her CPAP nightly.  Advised that if her symptoms worsen or she develops new symptoms she should let us know.  She will follow-up in 6 months or sooner if needed.   I spent 15 minutes with the patient. 50% of this time was spent reviewing CPAP download   Butch PennyMegan Linn Goetze, MSN, NP-C 08/22/2017, 11:18 AM [facility] [address], [address] [place], [place] [address] [phone]  I reviewed the above note and documentation by the Nurse Practitioner and agree with the history, physical exam, assessment and plan as outlined above. I was immediately available for face-to-face consultation. Huston FoleySaima Athar, MD, PhD [facility]

## 2017-08-28 DIAGNOSIS — G4733 Obstructive sleep apnea (adult) (pediatric): Secondary | ICD-10-CM | POA: Diagnosis not present

## 2017-08-31 DIAGNOSIS — R82998 Other abnormal findings in urine: Secondary | ICD-10-CM | POA: Diagnosis not present

## 2017-08-31 DIAGNOSIS — E559 Vitamin D deficiency, unspecified: Secondary | ICD-10-CM | POA: Diagnosis not present

## 2017-08-31 DIAGNOSIS — I10 Essential (primary) hypertension: Secondary | ICD-10-CM | POA: Diagnosis not present

## 2017-09-19 DIAGNOSIS — Z1212 Encounter for screening for malignant neoplasm of rectum: Secondary | ICD-10-CM | POA: Diagnosis not present

## 2017-09-26 DIAGNOSIS — G6289 Other specified polyneuropathies: Secondary | ICD-10-CM | POA: Diagnosis not present

## 2017-09-26 DIAGNOSIS — E559 Vitamin D deficiency, unspecified: Secondary | ICD-10-CM | POA: Diagnosis not present

## 2017-09-26 DIAGNOSIS — Z0000 Encounter for general adult medical examination without abnormal findings: Secondary | ICD-10-CM | POA: Diagnosis not present

## 2017-09-26 DIAGNOSIS — I10 Essential (primary) hypertension: Secondary | ICD-10-CM | POA: Diagnosis not present

## 2017-09-26 DIAGNOSIS — K589 Irritable bowel syndrome without diarrhea: Secondary | ICD-10-CM | POA: Diagnosis not present

## 2017-09-26 DIAGNOSIS — G4733 Obstructive sleep apnea (adult) (pediatric): Secondary | ICD-10-CM | POA: Diagnosis not present

## 2017-09-26 DIAGNOSIS — M810 Age-related osteoporosis without current pathological fracture: Secondary | ICD-10-CM | POA: Diagnosis not present

## 2017-09-26 DIAGNOSIS — Z23 Encounter for immunization: Secondary | ICD-10-CM | POA: Diagnosis not present

## 2017-09-26 DIAGNOSIS — R808 Other proteinuria: Secondary | ICD-10-CM | POA: Diagnosis not present

## 2017-09-27 DIAGNOSIS — G4733 Obstructive sleep apnea (adult) (pediatric): Secondary | ICD-10-CM | POA: Diagnosis not present

## 2017-10-03 ENCOUNTER — Other Ambulatory Visit: Payer: Self-pay | Admitting: Internal Medicine

## 2017-10-03 DIAGNOSIS — Z1231 Encounter for screening mammogram for malignant neoplasm of breast: Secondary | ICD-10-CM

## 2017-10-25 ENCOUNTER — Encounter: Payer: Self-pay | Admitting: Internal Medicine

## 2017-10-26 DIAGNOSIS — G4733 Obstructive sleep apnea (adult) (pediatric): Secondary | ICD-10-CM | POA: Diagnosis not present

## 2017-10-28 DIAGNOSIS — G4733 Obstructive sleep apnea (adult) (pediatric): Secondary | ICD-10-CM | POA: Diagnosis not present

## 2017-10-31 DIAGNOSIS — H2513 Age-related nuclear cataract, bilateral: Secondary | ICD-10-CM | POA: Diagnosis not present

## 2017-10-31 DIAGNOSIS — H1089 Other conjunctivitis: Secondary | ICD-10-CM | POA: Diagnosis not present

## 2017-11-16 ENCOUNTER — Ambulatory Visit: Payer: Commercial Managed Care - HMO

## 2017-11-17 ENCOUNTER — Ambulatory Visit
Admission: RE | Admit: 2017-11-17 | Discharge: 2017-11-17 | Disposition: A | Discharge status: Home / Self Care | Payer: Medicare HMO | Source: Ambulatory Visit | Attending: Internal Medicine | Admitting: Internal Medicine

## 2017-11-17 DIAGNOSIS — Z1231 Encounter for screening mammogram for malignant neoplasm of breast: Secondary | ICD-10-CM

## 2017-11-21 ENCOUNTER — Other Ambulatory Visit: Payer: Self-pay | Admitting: Internal Medicine

## 2017-11-21 DIAGNOSIS — R928 Other abnormal and inconclusive findings on diagnostic imaging of breast: Secondary | ICD-10-CM

## 2017-11-24 ENCOUNTER — Ambulatory Visit
Admission: RE | Admit: 2017-11-24 | Discharge: 2017-11-24 | Disposition: A | Discharge status: Home / Self Care | Payer: Medicare HMO | Source: Ambulatory Visit | Attending: Internal Medicine | Admitting: Internal Medicine

## 2017-11-24 DIAGNOSIS — R928 Other abnormal and inconclusive findings on diagnostic imaging of breast: Secondary | ICD-10-CM

## 2017-11-24 DIAGNOSIS — R921 Mammographic calcification found on diagnostic imaging of breast: Secondary | ICD-10-CM | POA: Diagnosis not present

## 2017-11-27 DIAGNOSIS — G4733 Obstructive sleep apnea (adult) (pediatric): Secondary | ICD-10-CM | POA: Diagnosis not present

## 2017-12-26 ENCOUNTER — Ambulatory Visit (AMBULATORY_SURGERY_CENTER): Payer: Self-pay

## 2017-12-26 VITALS — Ht 64.5 in | Wt 186.4 lb

## 2017-12-26 DIAGNOSIS — Z1211 Encounter for screening for malignant neoplasm of colon: Secondary | ICD-10-CM

## 2017-12-26 NOTE — Progress Notes (Signed)
Per pt, no allergies to soy or egg products.Pt not taking any weight loss meds or using  O2 at home.  Emmi video sent to patient's email 

## 2017-12-27 ENCOUNTER — Encounter: Payer: Self-pay | Admitting: Internal Medicine

## 2017-12-28 DIAGNOSIS — G4733 Obstructive sleep apnea (adult) (pediatric): Secondary | ICD-10-CM | POA: Diagnosis not present

## 2018-01-09 ENCOUNTER — Encounter: Payer: Self-pay | Admitting: Internal Medicine

## 2018-01-09 ENCOUNTER — Ambulatory Visit (AMBULATORY_SURGERY_CENTER): Payer: Medicare HMO | Admitting: Internal Medicine

## 2018-01-09 VITALS — BP 143/75 | HR 70 | Temp 98.6°F | Resp 16 | Ht 64.0 in | Wt 192.0 lb

## 2018-01-09 DIAGNOSIS — Z1211 Encounter for screening for malignant neoplasm of colon: Secondary | ICD-10-CM | POA: Diagnosis not present

## 2018-01-09 DIAGNOSIS — Z86010 Personal history of colonic polyps: Secondary | ICD-10-CM | POA: Diagnosis not present

## 2018-01-09 DIAGNOSIS — G4733 Obstructive sleep apnea (adult) (pediatric): Secondary | ICD-10-CM | POA: Diagnosis not present

## 2018-01-09 MED ORDER — SODIUM CHLORIDE 0.9 % IV SOLN: 500.0000 mL | Freq: Once | INTRAVENOUS | Status: DC

## 2018-01-09 NOTE — Patient Instructions (Addendum)
   The colon is normal. No more routine colonoscopy or stool tests (do not do the annual stool testing for blood).  Hope the Imodium keeps working - if not come back and see me.  I appreciate the opportunity to care for you. Iva Booparl E. Ellasyn Swilling, MD, FACG  YOU HAD AN ENDOSCOPIC PROCEDURE TODAY AT THE [facility]:   Refer to the procedure report that was given to you for any specific questions about what was found during the examination.  If the procedure report does not answer your questions, please call your gastroenterologist to clarify.  If you requested that your care partner not be given the details of your procedure findings, then the procedure report has been included in a sealed envelope for you to review at your convenience later.  YOU SHOULD EXPECT: Some feelings of bloating in the abdomen. Passage of more gas than usual.  Walking can help get rid of the air that was put into your GI tract during the procedure and reduce the bloating. If you had a lower endoscopy (such as a colonoscopy or flexible sigmoidoscopy) you may notice spotting of blood in your stool or on the toilet paper. If you underwent a bowel prep for your procedure, you may not have a normal bowel movement for a few days.  Please Note:  You might notice some irritation and congestion in your nose or some drainage.  This is from the oxygen used during your procedure.  There is no need for concern and it should clear up in a day or so.  SYMPTOMS TO REPORT IMMEDIATELY:   Following lower endoscopy (colonoscopy or flexible sigmoidoscopy):  Excessive amounts of blood in the stool  Significant tenderness or worsening of abdominal pains  Swelling of the abdomen that is new, acute  Fever of 100F or higher   For urgent or emergent issues, a gastroenterologist can be reached at any hour by calling [phone].   DIET:  We do recommend a small meal at first, but then you may proceed to your regular diet.   Drink plenty of fluids but you should avoid alcoholic beverages for 24 hours.  ACTIVITY:  You should plan to take it easy for the rest of today and you should NOT DRIVE or use heavy machinery until tomorrow (because of the sedation medicines used during the test).    FOLLOW UP: Our staff will call the number listed on your records the next business day following your procedure to check on you and address any questions or concerns that you may have regarding the information given to you following your procedure. If we do not reach you, we will leave a message.  However, if you are feeling well and you are not experiencing any problems, there is no need to return our call.  We will assume that you have returned to your regular daily activities without incident.  If any biopsies were taken you will be contacted by phone or by letter within the next 1-3 weeks.  Please call us at [phone] if you have not heard about the biopsies in 3 weeks.    SIGNATURES/CONFIDENTIALITY: You and/or your care partner have signed paperwork which will be entered into your electronic medical record.  These signatures attest to the fact that that the information above on your After Visit Summary has been reviewed and is understood.  Full responsibility of the confidentiality of this discharge information lies with you and/or your care-partner.

## 2018-01-09 NOTE — Progress Notes (Signed)
Report given to PACU, vss 

## 2018-01-09 NOTE — Progress Notes (Signed)
Pt's states no medical or surgical changes since previsit or office visit. 

## 2018-01-09 NOTE — Op Note (Signed)
[facility] Patient Name: Mcneil SoberVivian Dunlop Procedure Date: 01/09/2018 9:07 AM MRN: [id number] Endoscopist: Iva Booparl E Gessner , MD Age: [age] Referring MD:  Date of Birth: 1946-05-16 Gender: Female Account #: [id number] Procedure:                Colonoscopy Indications:              Screening for colorectal malignant neoplasm, Last                            colonoscopy: 2009 Medicines:                Propofol per Anesthesia, Monitored Anesthesia Care Procedure:                Pre-Anesthesia Assessment:                           - Prior to the procedure, a History and Physical                            was performed, and patient medications and                            allergies were reviewed. The patient's tolerance of                            previous anesthesia was also reviewed. The risks                            and benefits of the procedure and the sedation                            options and risks were discussed with the patient.                            All questions were answered, and informed consent                            was obtained. Prior Anticoagulants: The patient has                            taken no previous anticoagulant or antiplatelet                            agents. ASA Grade Assessment: II - A patient with                            mild systemic disease. After reviewing the risks                            and benefits, the patient was deemed in                            satisfactory condition to undergo the procedure.  After obtaining informed consent, the colonoscope                            was passed under direct vision. Throughout the                            procedure, the patient's blood pressure, pulse, and                            oxygen saturations were monitored continuously. The                            Colonoscope was introduced through the anus and                            advanced to the  the cecum, identified by                            appendiceal orifice and ileocecal valve. The                            colonoscopy was performed without difficulty. The                            patient tolerated the procedure well. The quality                            of the bowel preparation was good. The ileocecal                            valve, appendiceal orifice, and rectum were                            photographed. The bowel preparation used was                            Miralax. Scope In: 9:14:43 AM Scope Out: 9:25:10 AM Scope Withdrawal Time: 0 hours 8 minutes 49 seconds  Total Procedure Duration: 0 hours 10 minutes 27 seconds  Findings:                 The perianal and digital rectal examinations were                            normal.                           The colon (entire examined portion) appeared normal.                           No additional abnormalities were found on                            retroflexion. Complications:            No immediate complications. Estimated Blood Loss:  Estimated blood loss: none. Impression:               - The entire examined colon is normal.                           - No specimens collected. Recommendation:           - Patient has a contact number available for                            emergencies. The signs and symptoms of potential                            delayed complications were discussed with the                            patient. Return to normal activities tomorrow.                            Written discharge instructions were provided to the                            patient.                           - Continue present medications.                           - No repeat colonoscopy due to current age (25                            years or older) and the absence of colonic polyps.                            Does not need routine stool testing either.                            Incvestigate  signs/symptoms as needed. Iva Boop, MD 01/09/2018 9:30:34 AM This report has been signed electronically.

## 2018-01-10 ENCOUNTER — Telehealth: Payer: Self-pay | Admitting: *Deleted

## 2018-01-10 NOTE — Telephone Encounter (Signed)
  Follow up Call-  Call back number 01/09/2018  Post procedure Call Back phone  # [phone]  Permission to leave phone message Yes  Some recent data might be hidden     Patient questions:  Do you have a fever, pain , or abdominal swelling? No. Pain Score  0 *  Have you tolerated food without any problems? Yes.    Have you been able to return to your normal activities? Yes.    Do you have any questions about your discharge instructions: Diet   No. Medications  No. Follow up visit  No.  Do you have questions or concerns about your Care? No.  Actions: * If pain score is 4 or above: No action needed, pain <4.

## 2018-01-27 DIAGNOSIS — G4733 Obstructive sleep apnea (adult) (pediatric): Secondary | ICD-10-CM | POA: Diagnosis not present

## 2018-04-10 DIAGNOSIS — R7309 Other abnormal glucose: Secondary | ICD-10-CM | POA: Diagnosis not present

## 2018-07-30 ENCOUNTER — Encounter: Payer: Self-pay | Admitting: Adult Health

## 2018-09-04 ENCOUNTER — Ambulatory Visit: Payer: Medicare HMO | Admitting: Adult Health

## 2018-09-04 ENCOUNTER — Telehealth: Payer: Self-pay

## 2018-09-04 NOTE — Telephone Encounter (Signed)
Due to current COVID 19 pandemic, our office is severely reducing in office visits for at least the next 2 weeks, in order to minimize the risk to our patients and healthcare providers.   Megan, NP is also on maternity leave the day of pt's appt.  I called pt. She is agreeable to converting her appt to a virtual visit with Dr. Frances Furbish. She is unsure of the day and will call me back to schedule it.  Pt understands that although there may be some limitations with this type of visit, we will take all precautions to reduce any security or privacy concerns.  Pt understands that this will be treated like an in office visit and we will file with pt's insurance, and there may be a patient responsible charge related to this service.  Pt's email is [email or web address]. Pt understands that the [employer] webex software must be downloaded and operational on the device pt plans to use for the visit.  Pt's meds, allergies, and PMH were updated.   Pt reports that she weighs 185 lb 5' 4.5.  Pt reports that her cpap is going well.

## 2018-09-05 NOTE — Telephone Encounter (Signed)
I called pt. She is agreeable to an appt on 09/13/2018 at 3:00pm.  Pt understands that the software must be downloaded on her device prior to her appt.

## 2018-09-11 ENCOUNTER — Encounter: Payer: Self-pay | Admitting: Adult Health

## 2018-09-13 ENCOUNTER — Ambulatory Visit (INDEPENDENT_AMBULATORY_CARE_PROVIDER_SITE_OTHER): Payer: Medicare Other | Admitting: Neurology

## 2018-09-13 ENCOUNTER — Other Ambulatory Visit: Payer: Self-pay

## 2018-09-13 ENCOUNTER — Encounter: Payer: Self-pay | Admitting: Neurology

## 2018-09-13 DIAGNOSIS — G4733 Obstructive sleep apnea (adult) (pediatric): Secondary | ICD-10-CM

## 2018-09-13 DIAGNOSIS — Z9989 Dependence on other enabling machines and devices: Secondary | ICD-10-CM

## 2018-09-13 NOTE — Progress Notes (Signed)
Interim history:   [name] is a 73 year old right-handed woman with an underlying medical history of osteoporosis, plantar fasciitis, lactose intolerance, IBS, cataracts, hypertension, vitamin D deficiency and obesity, with whom Im conducting a virtual, video-based via Webex, in lieu of a face-to-face today for follow-up consultation of her obstructive sleep apnea, established on CPAP therapy. The patient is unaccompanied today and joins via iPad from home. I last saw her on 02/21/2017, at which time we talked about her home sleep test and her CPAP titration study. She was compliant with CPAP and reported doing well with it, felt improved in her sleep quality, sleep consolidation and daytime symptoms.   She saw Ward Givens, nurse practitioner in the interim on 08/22/2017, at which time she was compliant with CPAP and doing well.   Today, 09/13/2018  (all dictated new, as well as above notes, some dictation done in note pad or Word, outside of chart, may appear as copied):    I reviewed her CPAP compliance data from 08/13/2018 through 09/11/2018 which is a total of 30 days, during which time she used her machine 19 days with percent used days greater than 4 hours at 53%, indicating suboptimal compliance with an average usage for days on treatment of 7 hours and 2 minutes, residual AHI at goal at 0.6 per hour, leak acceptable with the 95th percentile at 13 L/m on a pressure of 9 cm with EPR of 2. In the past 90 days she has had better compliance, average usage of 7 hours and 19 minutes, percent used days greater than 4 hours at 70%. Set up date was 12/28/2016.  The patient's allergies, current medications, family history, past medical history, past social history, past surgical history and problem list were reviewed and updated as appropriate.    Previously (copied from previous notes for reference):   I first met her on 09/08/2016 at the request of her primary care physician, at which time  she reported snoring and excessive daytime somnolence. I suggested we proceed with sleep study testing. Her insurance denied and attended sleep study. She had a home sleep test on 10/17/2016 which showed an AHI of 26.9 per hour, O2 nadir of 77%. Based on her symptoms and test results indicating at least moderate obstructive sleep apnea, I suggested we proceed with an attended CPAP titration. She had a CPAP titration study on 12/01/2016. Sleep efficiency was 80.2%, sleep latency 4 minutes, REM latency mildly reduced at 64.5 minutes, she achieved an increased percentage of slow-wave sleep and REM sleep was normal at 20.2%. She was fitted with a nasal mask and CPAP was titrated from 5 cm to 9 cm. On the final pressure her AHI was 0 per hour with supine REM sleep achieved an O2 nadir of 95%. Based on her test results I prescribed CPAP therapy for home use.   I reviewed her CPAP compliance data from 01/19/2017 through 02/17/2017, which is a total of 30 days, during which time she used her CPAP 27 days with percent used days greater than 4 hours of 90%, indicating excellent compliance with an average usage of 7 hours and 56 minutes, residual AHI 1.1 per hour, leak low with the 95th percentile at 3.2 L/m on a pressure of 9 cm with EPR of 2.    09/08/16: (She) reports snoring and excessive daytime somnolence.  I reviewed your office note from 09/06/2016, which you kindly included. The patient is single, she lives alone. She quit smoking in 1985, she quit drinking  alcohol in 1990, does not drink caffeine on a daily basis. 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 She has 2 grown sons, 7 grandchildren, one great-grandchild and one great grandchild on the way. She does not have a set bedtime and wake time. She watches TV in bed at times. Bedtime may vary from 10 PM to 2 AM. Wake up time varies from 7 AM to 10 AM. She does not have night to night nocturia or recurrent morning headaches. She has she had a bedroom before with her friend on a  trip out of town and was told that she has loud snoring and also pauses in her breathing. Her grandchildren also reports for snoring to her. She does not always wake up rested. Epworth sleepiness score is 10 out of 24, fatigue score is 12 out of 63. She denies restless leg symptoms but has leg aching at times. She is not aware of any family history of OSA.  Her Past Medical History Is Significant For: Past Medical History:  Diagnosis Date   Anemia    in past   Cataracts, bilateral    Diarrhea    chronic   Ectopic pregnancy    Hypertension    IBS (irritable bowel syndrome)    Lactose intolerance    OSA on CPAP 2018   Osteoporosis    Plantar fasciitis    Sickle cell anemia (HCC)    has a trait   Sleep apnea    uses c-pap   Vitamin D deficiency     Her Past Surgical History Is Significant For: Past Surgical History:  Procedure Laterality Date   ABDOMINAL HYSTERECTOMY     BREAST BIOPSY Left 03/31/1997   benign   CHOLECYSTECTOMY  2008   COLONOSCOPY  2009   negative screening   TUBAL LIGATION      Her Family History Is Significant For: Family History  Problem Relation Age of Onset   Heart attack Father    Stroke Father    Diabetes Son    Stroke Paternal Grandmother    Breast cancer Cousin 40    Her Social History Is Significant For: Social History   Socioeconomic History   Marital status: Single    Spouse name: Not on file   Number of children: 2   Years of education: Not on file   Highest education level: Not on file  Occupational History   Occupation: retired  Scientist, product/process development strain: Not on file   Food insecurity:    Worry: Not on file    Inability: Not on file   Transportation needs:    Medical: Not on file    Non-medical: Not on file  Tobacco Use   Smoking status: Former Smoker    Types: Cigarettes    Last attempt to quit: 05/31/1983    Years since quitting: 35.3   Smokeless tobacco: Never Used    Substance and Sexual Activity   Alcohol use: No   Drug use: No   Sexual activity: Not on file  Lifestyle   Physical activity:    Days per week: Not on file    Minutes per session: Not on file   Stress: Not on file  Relationships   Social connections:    Talks on phone: Not on file    Gets together: Not on file    Attends religious service: Not on file    Active member of club or organization: Not on file    Attends meetings of clubs or  organizations: Not on file    Relationship status: Not on file  Other Topics Concern   Not on file  Social History Narrative   Not on file    Her Allergies Are:  Allergies  Allergen Reactions   Ibuprofen     Pain in stomach/nausea   Penicillins     Pain in stomach  :   Her Current Medications Are:  Outpatient Encounter Medications as of 09/13/2018  Medication Sig   cholecalciferol (VITAMIN D) 1000 units tablet Take 1,000 Units by mouth daily. Take 2 pills daily   lisinopril-hydrochlorothiazide (PRINZIDE,ZESTORETIC) 10-12.5 MG tablet Take 1 tablet by mouth daily.   loperamide (IMODIUM A-D) 2 MG tablet Take 2 mg by mouth 4 (four) times daily as needed for diarrhea or loose stools.   OVER THE COUNTER MEDICATION Equate All day pain relief-Take one daily   No facility-administered encounter medications on file as of 09/13/2018.   :  Review of Systems:  Out of a complete 14 point review of systems, all are reviewed and negative with the exception of these symptoms as listed below:  Virtual Visit via Video Note on 09/13/2018:  I connected with Ms. Hockey on 09/13/18 at  3:00 PM EDT by a video enabled telemedicine application and verified that I am speaking with the correct person using two identifiers.   I discussed the limitations of evaluation and management by telemedicine and the availability of in person appointments. The patient expressed understanding and agreed to proceed.  History of Present Illness: She reports  doing well. She has not had any recent illness thankfully. She uses a ResMed nasal cushion interface with the hose coming out on top for her mass, she demonstrates it by putting it on during this video call. She does admit that sometimes she falls asleep without the mask on and sometimes she sleeps in a different bedroom and sometimes she is not at home at night and visits family and has not taken it always with her. Overall she is compliant and benefits from treatment, she is up-to-date with her supplies, nose to clean theand also to replace it in a timely fashion and to replace her filter in a timely manner, she is doing well with all of that.   Observations/Objective:  There are no recent vital signs available for my reviewing her chart, the most recent available vital signs in the chart are from 01/09/2018. She is very pleasant, conversant, in no acute distress. She appears to be hard of hearing and has to hold her eye pad very close to her ear. She has normal facial animation, extraocular movements are preserved, she wears corrective eyeglasses. Speech is clear without dysarthria, hypophonia or voice tremor detected. Comprehension is good.  Assessment and Plan: In summary, Kyasia Steuck Robersonis a very pleasant 73 year old female with an underlying medical history of osteoporosis, plantar fasciitis, lactose intolerance, IBS, cataracts, hypertension, vitamin D deficiency and obesity, who presents for follow-up consultation of her obstructive sleep apnea, after recent sleep study testing. She had a home sleep test on 10/17/2016 followed by a CPAP titration study in house on 12/01/2016. She did well with CPAP therapy. She has established treatment at home with very good compliance and good results reported. She is commended for her treatment adherence. We talked about her home sleep test results indicating at least moderate obstructive sleep apnea and her CPAP titration study results, reviewed her compliance  data together as well. Physical exam is stable. I explained the importance of being  compliant with PAP treatment, not only for insurance purposes but primarily to improve Hersymptoms, and for the patient's long term health benefit, including to reduce Hercardiovascular risks.  I suggested a 6 month follow-up, sooner as needed, with one of our NPs. I answered all her questions today and she was in agreement.  I  Follow Up Instructions: 1. Continue using CPAP regularly with full compliance, patient is commended for treatment adherence. 2. Follow-up yearly, with NP next time.  3. CPAP supply order placed, will fax to [place]. 4. Call or email through My Chart for any interim questions or concerns.    I discussed the assessment and treatment plan with the patient. The patient was provided an opportunity to ask questions and all were answered. The patient agreed with the plan and demonstrated an understanding of the instructions.   The patient was advised to call back or seek an in-person evaluation if the symptoms worsen or if the condition fails to improve as anticipated.  I provided 20 minutes of non-face-to-face time during this encounter.   Star Age, MD

## 2018-09-13 NOTE — Patient Instructions (Signed)
Given verbally, during today's virtual video-based encounter, with verbal feedback received.   

## 2018-09-13 NOTE — Progress Notes (Signed)
Order for cpap supplies sent to [facility] via community message. Confirmation received that the order transmitted was successful.  

## 2018-09-17 ENCOUNTER — Telehealth: Payer: Self-pay | Admitting: Neurology

## 2018-09-17 NOTE — Telephone Encounter (Signed)
She may need to turn up the humidity setting on the machine. If she needs guidance as to how to do this, her DME provider can walk her through it even over the phone, please advise patient, she can call her DME company. Also, she should make sure she stays well hydrated with water through the day, may need to take sips of water at night. It may help to have a cup with a straw next to bed.

## 2018-09-17 NOTE — Telephone Encounter (Signed)
Called patient to schedule her follow-up post virtual visit. She stated that she forgot to mention to Dr. Frances Furbish that sometimes when she wakes up in the morning she experiences an extremely dry mouth/throat. She wants to know if this is something that can be prevented. I advised her that I will pass this information along to Dr. Frances Furbish and her nurse.

## 2018-09-17 NOTE — Telephone Encounter (Signed)
I called pt and discussed this with her. She will call [facility] to discuss the humidity settings. Pt verbalized understanding of the recommendations. Pt had no questions at this time but was encouraged to call back if questions arise.

## 2018-09-24 ENCOUNTER — Ambulatory Visit: Payer: Medicare HMO | Admitting: Adult Health

## 2018-10-10 ENCOUNTER — Encounter (HOSPITAL_COMMUNITY): Payer: Self-pay

## 2018-10-10 ENCOUNTER — Other Ambulatory Visit: Payer: Self-pay

## 2018-10-10 ENCOUNTER — Ambulatory Visit (HOSPITAL_COMMUNITY)
Admission: EM | Admit: 2018-10-10 | Discharge: 2018-10-10 | Disposition: A | Discharge status: Home / Self Care | Payer: Medicare Other | Attending: Family Medicine | Admitting: Family Medicine

## 2018-10-10 DIAGNOSIS — S39012A Strain of muscle, fascia and tendon of lower back, initial encounter: Secondary | ICD-10-CM

## 2018-10-10 MED ORDER — KETOROLAC TROMETHAMINE 30 MG/ML IJ SOLN
INTRAMUSCULAR | Status: AC
  Filled 2018-10-10: qty 1

## 2018-10-10 MED ORDER — TIZANIDINE HCL 4 MG PO TABS: 4.0000 mg | ORAL_TABLET | Freq: Four times a day (QID) | ORAL | 0 refills | Status: DC | PRN

## 2018-10-10 MED ORDER — KETOROLAC TROMETHAMINE 30 MG/ML IJ SOLN: 30.0000 mg | Freq: Once | INTRAMUSCULAR | Status: DC
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
## 2018-10-16 ENCOUNTER — Other Ambulatory Visit: Payer: Self-pay | Admitting: Internal Medicine

## 2018-10-16 DIAGNOSIS — Z1231 Encounter for screening mammogram for malignant neoplasm of breast: Secondary | ICD-10-CM
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
## 2018-11-27 IMAGING — MG DIGITAL DIAGNOSTIC UNILATERAL RIGHT MAMMOGRAM
3 series · 3 of 3 positions shown · non-contrast
Comparison: [DATE] [DATE], [DATE], [DATE] [DATE], [DATE], [DATE] [DATE], [DATE], [DATE] [DATE],
[DATE], [DATE] [DATE], [DATE], [DATE] [DATE], [DATE]

CLINICAL DATA: 71-year-old patient recalled from recent screening
mammography for evaluation of calcifications in the lower inner
right breast.

EXAM:
DIGITAL DIAGNOSTIC RIGHT MAMMOGRAM

[R ML (1 of 2)]
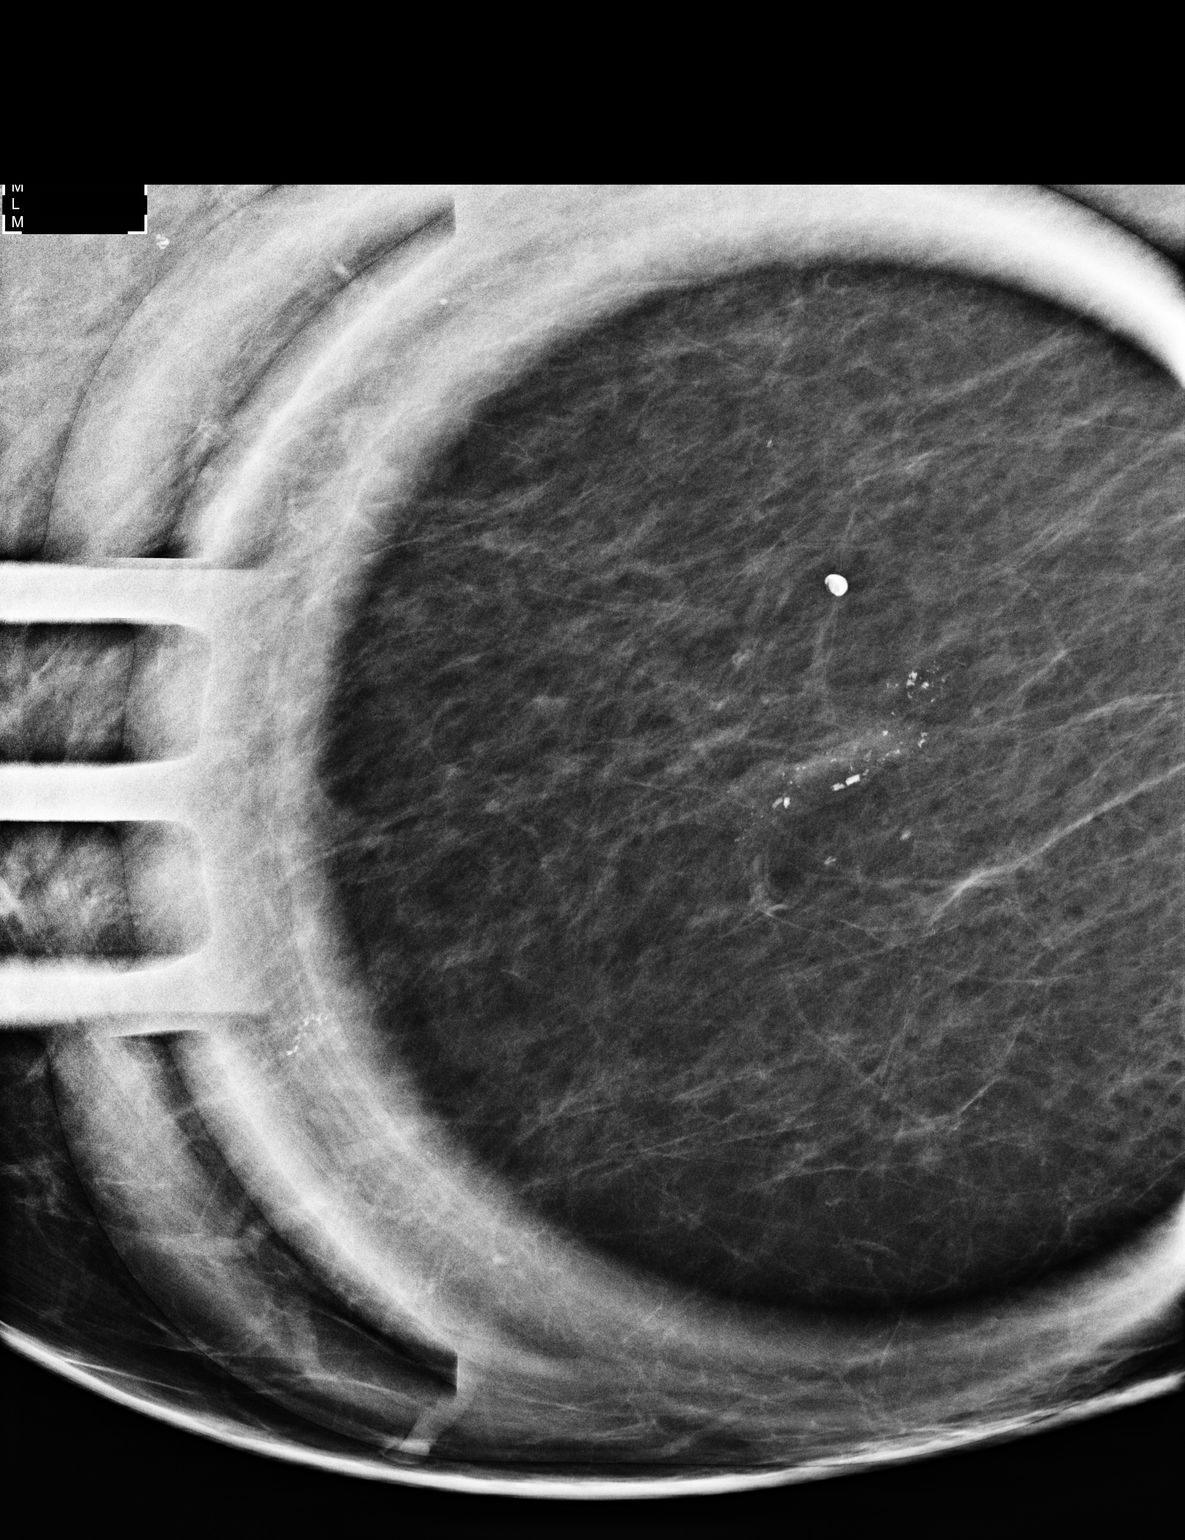

[R CC]
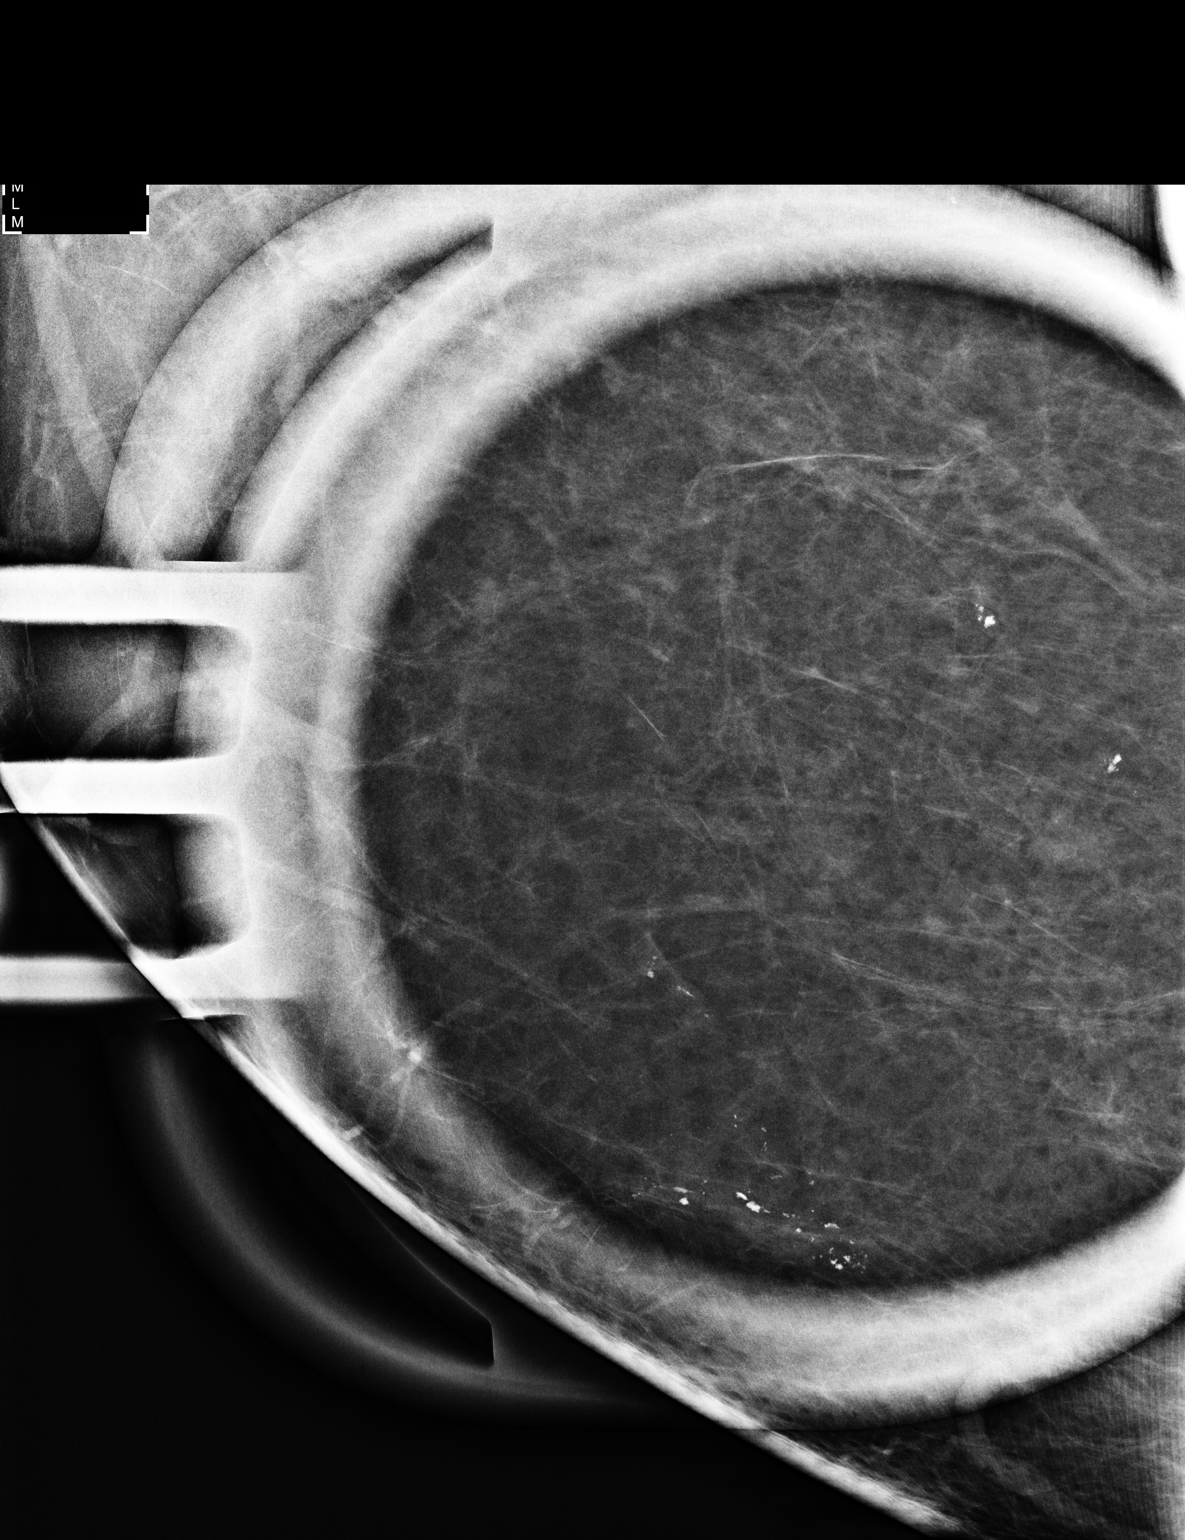

[R ML (2 of 2)]
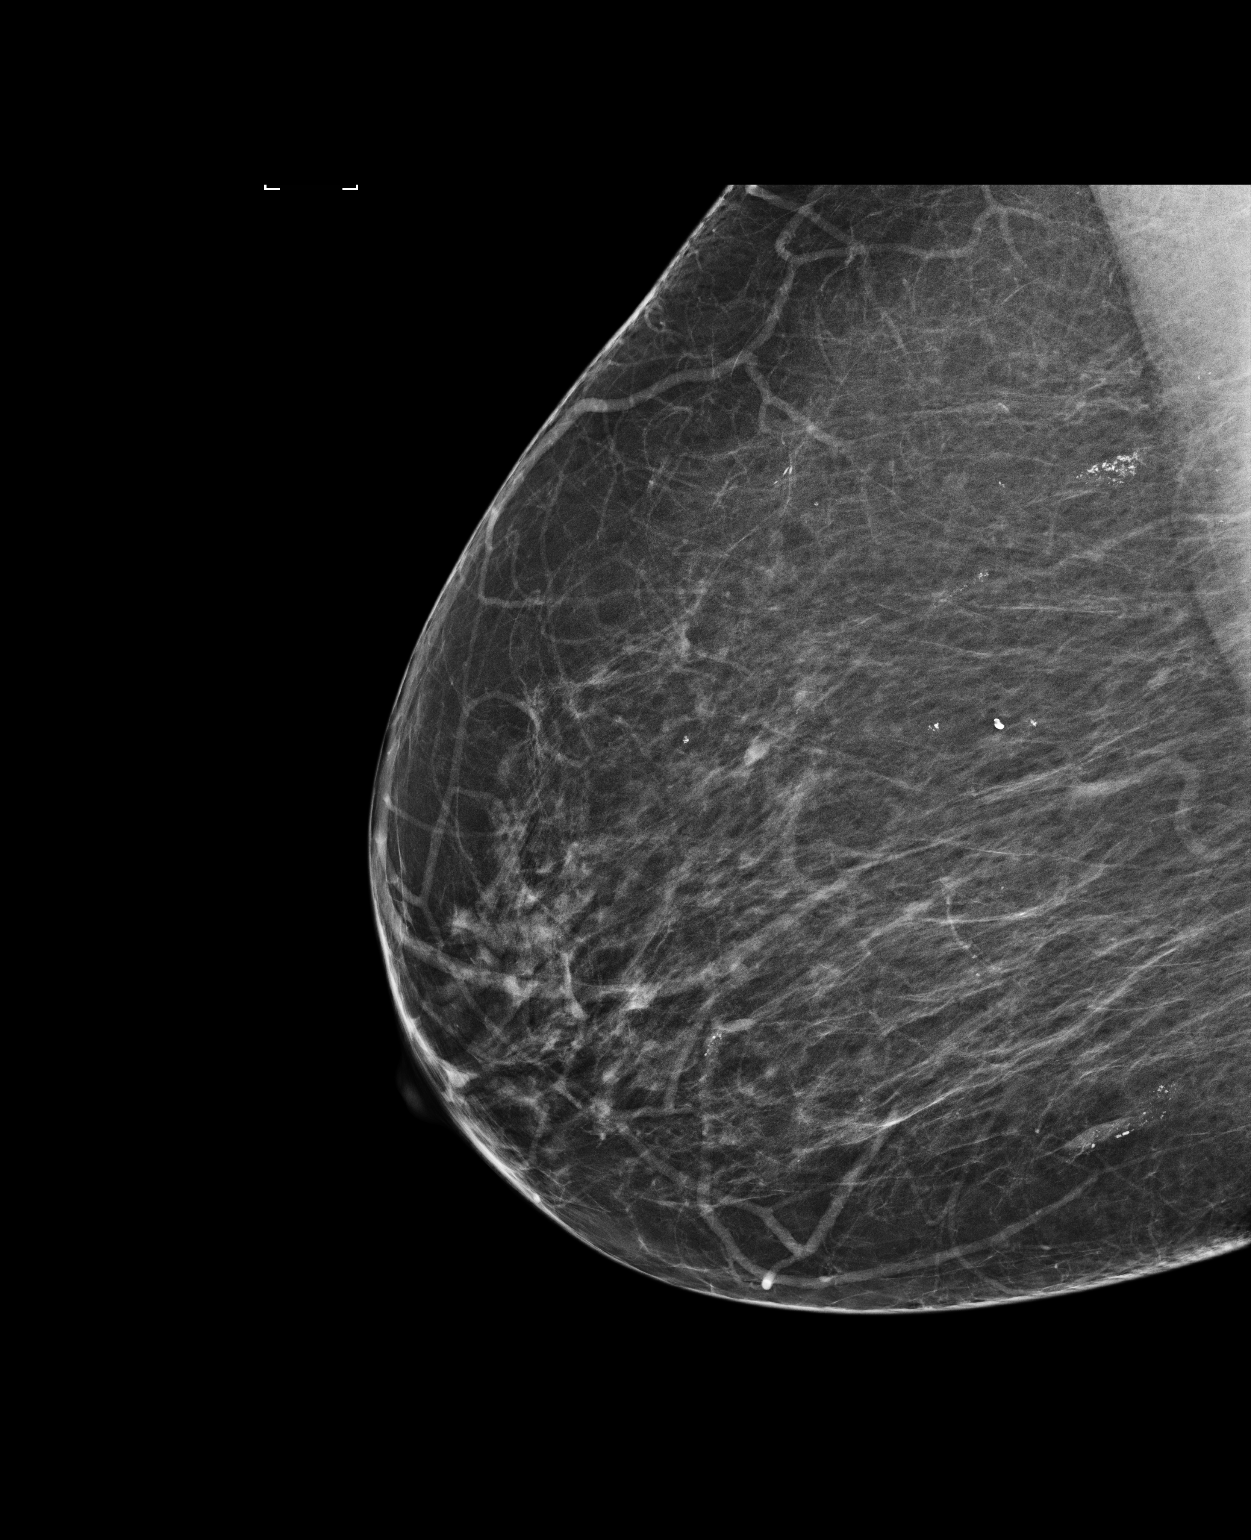

[3 of 3 positions shown; findings below may reference images not displayed]

ACR Breast Density Category b: There are scattered areas of
fibroglandular density.
FINDINGS: Magnification views of the lower inner right breast show dystrophic
Raynor calcifications that are similar to other calcifications
in both breasts, and most consistent with degenerating
fibroadenoma(s).
IMPRESSION: Benign-appearing calcifications in the right breast similar to other
benign appearing calcifications bilaterally. No evidence of
malignancy.

RECOMMENDATION:
Screening mammogram in one year.(Code:[id number]-GOF)

I have discussed the findings and recommendations with the patient.
Results were also provided in writing at the conclusion of the
visit. If applicable, a reminder letter will be sent to the patient
regarding the next appointment.

BI-RADS CATEGORY  2: Benign.

## 2018-12-06 ENCOUNTER — Ambulatory Visit: Payer: Medicare Other

## 2018-12-07 ENCOUNTER — Ambulatory Visit: Payer: Medicare Other

## 2019-01-23 ENCOUNTER — Ambulatory Visit
Admission: RE | Admit: 2019-01-23 | Discharge: 2019-01-23 | Disposition: A | Discharge status: Home / Self Care | Payer: Medicare Other | Source: Ambulatory Visit | Attending: Internal Medicine | Admitting: Internal Medicine

## 2019-01-23 ENCOUNTER — Other Ambulatory Visit: Payer: Self-pay

## 2019-01-23 DIAGNOSIS — Z1231 Encounter for screening mammogram for malignant neoplasm of breast: Secondary | ICD-10-CM

## 2019-02-02 ENCOUNTER — Emergency Department (HOSPITAL_COMMUNITY): Payer: Medicare Other

## 2019-02-02 ENCOUNTER — Other Ambulatory Visit: Payer: Self-pay

## 2019-02-02 ENCOUNTER — Encounter (HOSPITAL_COMMUNITY): Payer: Self-pay | Admitting: Emergency Medicine

## 2019-02-02 ENCOUNTER — Emergency Department (HOSPITAL_COMMUNITY)
Admission: EM | Admit: 2019-02-02 | Discharge: 2019-02-02 | Disposition: A | Discharge status: Home / Self Care | Payer: Medicare Other | Attending: Emergency Medicine | Admitting: Emergency Medicine

## 2019-02-02 DIAGNOSIS — Y999 Unspecified external cause status: Secondary | ICD-10-CM | POA: Diagnosis not present

## 2019-02-02 DIAGNOSIS — M546 Pain in thoracic spine: Secondary | ICD-10-CM | POA: Diagnosis not present

## 2019-02-02 DIAGNOSIS — R072 Precordial pain: Secondary | ICD-10-CM | POA: Diagnosis not present

## 2019-02-02 DIAGNOSIS — Y9389 Activity, other specified: Secondary | ICD-10-CM | POA: Insufficient documentation

## 2019-02-02 DIAGNOSIS — Y92414 Local residential or business street as the place of occurrence of the external cause: Secondary | ICD-10-CM | POA: Diagnosis not present

## 2019-02-02 DIAGNOSIS — Z87891 Personal history of nicotine dependence: Secondary | ICD-10-CM | POA: Diagnosis not present

## 2019-02-02 DIAGNOSIS — I10 Essential (primary) hypertension: Secondary | ICD-10-CM | POA: Insufficient documentation

## 2019-02-02 DIAGNOSIS — Z79899 Other long term (current) drug therapy: Secondary | ICD-10-CM | POA: Insufficient documentation

## 2019-02-02 MED ORDER — ACETAMINOPHEN 325 MG PO TABS
650.0000 mg | ORAL_TABLET | Freq: Once | ORAL | Status: AC
  Administered 2019-02-02: 16:00:00 650 mg via ORAL
  Filled 2019-02-02: qty 2
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
## 2019-05-30 ENCOUNTER — Other Ambulatory Visit: Payer: Self-pay

## 2019-05-30 DIAGNOSIS — Z20822 Contact with and (suspected) exposure to covid-19: Secondary | ICD-10-CM

## 2019-05-31 LAB — NOVEL CORONAVIRUS, NAA: SARS-CoV-2, NAA: NOT DETECTED

## 2019-06-27 ENCOUNTER — Other Ambulatory Visit: Payer: Self-pay

## 2019-06-27 DIAGNOSIS — Z20822 Contact with and (suspected) exposure to covid-19: Secondary | ICD-10-CM

## 2019-06-28 LAB — NOVEL CORONAVIRUS, NAA: SARS-CoV-2, NAA: NOT DETECTED

## 2019-07-07 ENCOUNTER — Ambulatory Visit: Payer: Medicare Other | Attending: Internal Medicine

## 2019-07-07 DIAGNOSIS — Z23 Encounter for immunization: Secondary | ICD-10-CM | POA: Insufficient documentation

## 2019-07-07 NOTE — Progress Notes (Signed)
   Covid-19 Vaccination Clinic  Name:  Laura Robles    MRN: [id number] DOB: [birth year]  07/07/2019  Laura Robles was observed post Covid-19 immunization for 15 minutes without incidence. She was provided with Vaccine Information Sheet and instruction to access the V-Safe system.   Laura Robles was instructed to call 911 with any severe reactions post vaccine: Marland Kitchen Difficulty breathing  . Swelling of your face and throat  . A fast heartbeat  . A bad rash all over your body  . Dizziness and weakness    Immunizations Administered    Name Date Dose VIS Date Route   Pfizer COVID-19 Vaccine 07/07/2019  3:52 PM 0.3 mL 05/10/2019 Intramuscular   Manufacturer: [employer], [employer]   Lot: [id number]   NDC: [address]-9

## 2019-07-31 ENCOUNTER — Ambulatory Visit: Payer: Medicare Other | Attending: Internal Medicine

## 2019-07-31 ENCOUNTER — Ambulatory Visit: Payer: Medicare Other

## 2019-07-31 DIAGNOSIS — Z23 Encounter for immunization: Secondary | ICD-10-CM | POA: Insufficient documentation

## 2019-07-31 NOTE — Progress Notes (Signed)
   Covid-19 Vaccination Clinic  Name:  Laura Robles    MRN: [id number] DOB: 03-11-1946  07/31/2019  Laura Robles was observed post Covid-19 immunization for 15 minutes without incident. She was provided with Vaccine Information Sheet and instruction to access the V-Safe system.   Laura Robles was instructed to call 911 with any severe reactions post vaccine: Marland Kitchen Difficulty breathing  . Swelling of face and throat  . A fast heartbeat  . A bad rash all over body  . Dizziness and weakness   Immunizations Administered    Name Date Dose VIS Date Route   Pfizer COVID-19 Vaccine 07/31/2019  2:53 PM 0.3 mL 05/10/2019 Intramuscular   Manufacturer: [employer], [employer]   Lot: [id number]   NDC: [address]-4

## 2019-09-09 ENCOUNTER — Ambulatory Visit: Payer: Medicare Other | Attending: Internal Medicine

## 2019-09-09 DIAGNOSIS — Z20822 Contact with and (suspected) exposure to covid-19: Secondary | ICD-10-CM

## 2019-09-11 LAB — SARS-COV-2, NAA 2 DAY TAT

## 2019-09-11 LAB — NOVEL CORONAVIRUS, NAA: SARS-CoV-2, NAA: NOT DETECTED

## 2019-09-17 ENCOUNTER — Encounter: Payer: Self-pay | Admitting: Adult Health

## 2019-09-17 ENCOUNTER — Other Ambulatory Visit: Payer: Self-pay

## 2019-09-17 ENCOUNTER — Telehealth: Payer: Self-pay | Admitting: Adult Health

## 2019-09-17 ENCOUNTER — Ambulatory Visit: Payer: Medicare Other | Admitting: Adult Health

## 2019-09-17 VITALS — BP 134/73 | HR 73 | Temp 97.2°F | Ht 64.5 in | Wt 192.6 lb

## 2019-09-17 DIAGNOSIS — G4733 Obstructive sleep apnea (adult) (pediatric): Secondary | ICD-10-CM

## 2019-09-17 DIAGNOSIS — Z9989 Dependence on other enabling machines and devices: Secondary | ICD-10-CM

## 2019-09-17 NOTE — Telephone Encounter (Signed)
Patient was seen today and stated at check-out that she did not want to schedule her 1 year follow-up yet. FYI

## 2019-09-17 NOTE — Patient Instructions (Signed)
Continue using CPAP nightly and greater than 4 hours each night °If your symptoms worsen or you develop new symptoms please let us know.  ° °

## 2019-09-17 NOTE — Progress Notes (Signed)
PATIENT: Laura Robles DOB: 1946/04/27  REASON FOR VISIT: follow up HISTORY FROM: patient  HISTORY OF PRESENT ILLNESS: Today 09/17/19:  Laura Robles is a 74 year old female with a history of obstructive sleep apnea on CPAP.  She returns today for follow-up.  Her CPAP download indicates that she used her machine 26 out of 30 days for compliance of 87%.  She is averaging greater than 4 hours each night.  On average she uses her machine 7 hours and 21 minutes.  Her residual AHI is 1.2 on 9 cm of water with EPR 2.  Leak in the 95th percentile is 15 L/min.  Reports that the CPAP is working well.  She returns today for an evaluation  HISTORY I reviewed her CPAP compliance data from 08/13/2018 through 09/11/2018 which is a total of 30 days, during which time she used her machine 19 days with percent used days greater than 4 hours at 53%, indicating suboptimal compliance with an average usage for days on treatment of 7 hours and 2 minutes, residual AHI at goal at 0.6 per hour, leak acceptable with the 95th percentile at 13 L/m on a pressure of 9 cm with EPR of 2. In the past 90 days she has had better compliance, average usage of 7 hours and 19 minutes, percent used days greater than 4 hours at 70%. Set up date was 12/28/2016.   REVIEW OF SYSTEMS: Out of a complete 14 system review of symptoms, the patient complains only of the following symptoms, and all other reviewed systems are negative.  FSS 7  ALLERGIES: Allergies  Allergen Reactions  . Ibuprofen     Pain in stomach/nausea  . Penicillins     Pain in stomach    HOME MEDICATIONS: Outpatient Medications Prior to Visit  Medication Sig Dispense Refill  . Cholecalciferol (VITAMIN D3) 125 MCG (5000 UT) CAPS Take by mouth. One daily    . Cyanocobalamin (VITAMIN B-12 PO) Take by mouth. 2500 mg daily    . lisinopril (ZESTRIL) 20 MG tablet Take 20 mg by mouth daily.    Marland Kitchen loperamide (IMODIUM A-D) 2 MG tablet Take 2 mg by mouth 4 (four)  times daily as needed for diarrhea or loose stools.    . Omega-3 Fatty Acids (FISH OIL) 500 MG CAPS Take by mouth. One daily    . OVER THE COUNTER MEDICATION Equate All day pain relief-Take one daily    . tiZANidine (ZANAFLEX) 4 MG tablet Take 1 tablet (4 mg total) by mouth every 6 (six) hours as needed for muscle spasms. 30 tablet 0  . vitamin E 180 MG (400 UNITS) capsule Take 400 Units by mouth daily.    . cholecalciferol (VITAMIN D) 1000 units tablet Take 1,000 Units by mouth daily. Take 2 pills daily    . lisinopril-hydrochlorothiazide (PRINZIDE,ZESTORETIC) 10-12.5 MG tablet Take 1 tablet by mouth daily.     No facility-administered medications prior to visit.    PAST MEDICAL HISTORY: Past Medical History:  Diagnosis Date  . Anemia    in past  . Cataracts, bilateral   . Diarrhea    chronic  . Ectopic pregnancy   . Hypertension   . IBS (irritable bowel syndrome)   . Lactose intolerance   . OSA on CPAP 2018  . Osteoporosis   . Plantar fasciitis   . Sickle cell anemia (HCC)    has a trait  . Sleep apnea    uses c-pap  . Vitamin D deficiency  PAST SURGICAL HISTORY: Past Surgical History:  Procedure Laterality Date  . ABDOMINAL HYSTERECTOMY    . BREAST BIOPSY Left 03/31/1997   benign  . CHOLECYSTECTOMY  2008  . COLONOSCOPY  2009   negative screening  . TUBAL LIGATION      FAMILY HISTORY: Family History  Problem Relation Age of Onset  . Heart attack Father   . Stroke Father   . Diabetes Son   . Stroke Paternal Grandmother   . Breast cancer Cousin 30    SOCIAL HISTORY: Social History   Socioeconomic History  . Marital status: Single    Spouse name: Not on file  . Number of children: 2  . Years of education: Not on file  . Highest education level: Not on file  Occupational History  . Occupation: retired  Tobacco Use  . Smoking status: Former Smoker    Types: Cigarettes    Quit date: 05/31/1983    Years since quitting: 36.3  . Smokeless tobacco:  Never Used  Substance and Sexual Activity  . Alcohol use: No  . Drug use: No  . Sexual activity: Not on file  Other Topics Concern  . Not on file  Social History Narrative  . Not on file   Social Determinants of Health   Financial Resource Strain:   . Difficulty of Paying Living Expenses:   Food Insecurity:   . Worried About Charity fundraiser in the Last Year:   . Arboriculturist in the Last Year:   Transportation Needs:   . Film/video editor (Medical):   Marland Kitchen Lack of Transportation (Non-Medical):   Physical Activity:   . Days of Exercise per Week:   . Minutes of Exercise per Session:   Stress:   . Feeling of Stress :   Social Connections:   . Frequency of Communication with Friends and Family:   . Frequency of Social Gatherings with Friends and Family:   . Attends Religious Services:   . Active Member of Clubs or Organizations:   . Attends Archivist Meetings:   Marland Kitchen Marital Status:   Intimate Partner Violence:   . Fear of Current or Ex-Partner:   . Emotionally Abused:   Marland Kitchen Physically Abused:   . Sexually Abused:       PHYSICAL EXAM  Vitals:   09/17/19 1253  BP: 134/73  Pulse: 73  Temp: (!) 97.2 F (36.2 C)  Weight: 192 lb 9.6 oz (87.4 kg)  Height: 5' 4.5" (1.638 m)   Body mass index is 32.55 kg/m.  Generalized: Well developed, in no acute distress  Chest: Lungs clear to auscultation bilaterally  Neurological examination  Mentation: Alert oriented to time, place, history taking. Follows all commands speech and language fluent Cranial nerve II-XII: Extraocular movements were full, visual field were full on confrontational test Head turning and shoulder shrug  were normal and symmetric. Motor: The motor testing reveals 5 over 5 strength of all 4 extremities. Good symmetric motor tone is noted throughout.  Sensory: Sensory testing is intact to soft touch on all 4 extremities. No evidence of extinction is noted.  Gait and station: Gait is normal.     DIAGNOSTIC DATA (LABS, IMAGING, TESTING) - I reviewed patient records, labs, notes, testing and imaging myself where available.  Lab Results  Component Value Date   WBC 4.1 08/26/2009   HGB 15.3 (H) 08/26/2009   HCT 44.7 08/26/2009   MCV 80.5 08/26/2009   PLT 292 08/26/2009  Component Value Date/Time   NA 140 08/06/2007 2138   K 4.2 08/06/2007 2138   CL 100 08/06/2007 2138   CO2 27 08/06/2007 2138   GLUCOSE 94 08/06/2007 2138   BUN 8 08/06/2007 2138   CREATININE 0.72 08/06/2007 2138   CALCIUM 9.0 08/06/2007 2138   PROT 7.2 08/06/2007 2138   ALBUMIN 4.2 08/06/2007 2138   AST 18 08/06/2007 2138   ALT 20 08/06/2007 2138   ALKPHOS 68 08/06/2007 2138   BILITOT 0.5 08/06/2007 2138   Lab Results  Component Value Date   CHOL 136 08/26/2009   HDL 54 08/26/2009   LDLCALC 50 08/26/2009   TRIG 161 (H) 08/26/2009   CHOLHDL 2.5 Ratio 08/26/2009      ASSESSMENT AND PLAN 74 y.o. year old female  has a past medical history of Anemia, Cataracts, bilateral, Diarrhea, Ectopic pregnancy, Hypertension, IBS (irritable bowel syndrome), Lactose intolerance, OSA on CPAP (2018), Osteoporosis, Plantar fasciitis, Sickle cell anemia (HCC), Sleep apnea, and Vitamin D deficiency. here with:  1. OSA on CPAP  - CPAP compliance excellent - Good treatment of AHI  - Encourage patient to use CPAP nightly and > 4 hours each night - F/U in 1 year or sooner if needed   I spent 20 minutes of face-to-face and non-face-to-face time with patient.  This included previsit chart review, lab review, study review, order entry, electronic health record documentation, patient education.  Butch Penny, MSN, NP-C 09/17/2019, 1:04 PM [facility] [address] [place], [place] [address] [phone]

## 2019-09-18 ENCOUNTER — Ambulatory Visit: Payer: Medicare Other | Admitting: Adult Health

## 2020-01-31 ENCOUNTER — Other Ambulatory Visit: Payer: Self-pay

## 2020-01-31 ENCOUNTER — Other Ambulatory Visit: Payer: Medicare Other

## 2020-01-31 DIAGNOSIS — Z20822 Contact with and (suspected) exposure to covid-19: Secondary | ICD-10-CM

## 2020-02-01 LAB — NOVEL CORONAVIRUS, NAA: SARS-CoV-2, NAA: NOT DETECTED

## 2020-02-17 ENCOUNTER — Other Ambulatory Visit: Payer: Self-pay | Admitting: Internal Medicine

## 2020-02-17 DIAGNOSIS — Z1231 Encounter for screening mammogram for malignant neoplasm of breast: Secondary | ICD-10-CM

## 2020-02-18 ENCOUNTER — Other Ambulatory Visit: Payer: Medicare Other

## 2020-02-18 DIAGNOSIS — Z20822 Contact with and (suspected) exposure to covid-19: Secondary | ICD-10-CM

## 2020-02-19 ENCOUNTER — Other Ambulatory Visit: Payer: Self-pay

## 2020-02-19 ENCOUNTER — Ambulatory Visit
Admission: RE | Admit: 2020-02-19 | Discharge: 2020-02-19 | Disposition: A | Discharge status: Home / Self Care | Payer: Medicare Other | Source: Ambulatory Visit | Attending: Internal Medicine | Admitting: Internal Medicine

## 2020-02-19 DIAGNOSIS — Z1231 Encounter for screening mammogram for malignant neoplasm of breast: Secondary | ICD-10-CM

## 2020-02-20 LAB — NOVEL CORONAVIRUS, NAA: SARS-CoV-2, NAA: NOT DETECTED

## 2020-02-20 LAB — SARS-COV-2, NAA 2 DAY TAT

## 2020-03-28 ENCOUNTER — Ambulatory Visit: Payer: Medicare Other | Attending: Internal Medicine

## 2020-03-28 ENCOUNTER — Other Ambulatory Visit: Payer: Self-pay

## 2020-03-28 DIAGNOSIS — Z23 Encounter for immunization: Secondary | ICD-10-CM

## 2020-03-28 NOTE — Progress Notes (Signed)
   Covid-19 Vaccination Clinic  Name:  Laura Robles    MRN: [id number] DOB: 01/26/1946  03/28/2020  Laura Robles was observed post Covid-19 immunization for 15 minutes without incident. She was provided with Vaccine Information Sheet and instruction to access the V-Safe system.   Laura Robles was instructed to call 911 with any severe reactions post vaccine: Marland Kitchen Difficulty breathing  . Swelling of face and throat  . A fast heartbeat  . A bad rash all over body  . Dizziness and weakness

## 2020-04-28 ENCOUNTER — Other Ambulatory Visit: Payer: Medicare Other

## 2020-04-28 DIAGNOSIS — Z20822 Contact with and (suspected) exposure to covid-19: Secondary | ICD-10-CM

## 2020-04-29 LAB — NOVEL CORONAVIRUS, NAA: SARS-CoV-2, NAA: NOT DETECTED

## 2020-04-29 LAB — SARS-COV-2, NAA 2 DAY TAT

## 2020-05-04 ENCOUNTER — Telehealth: Payer: Self-pay | Admitting: Adult Health

## 2020-05-04 NOTE — Telephone Encounter (Signed)
FYI: Pt called, left for a trip and did not bring my strap for my mask. I am trying find a strap, but if not I will not be on my CPAP machine. Will be back home on December 15.

## 2020-05-19 ENCOUNTER — Other Ambulatory Visit: Payer: Medicare Other

## 2020-05-19 DIAGNOSIS — Z20822 Contact with and (suspected) exposure to covid-19: Secondary | ICD-10-CM

## 2020-05-20 LAB — SARS-COV-2, NAA 2 DAY TAT

## 2020-05-20 LAB — NOVEL CORONAVIRUS, NAA: SARS-CoV-2, NAA: NOT DETECTED

## 2020-06-16 ENCOUNTER — Other Ambulatory Visit: Payer: Medicare Other

## 2020-06-17 ENCOUNTER — Other Ambulatory Visit: Payer: Medicare Other

## 2020-06-17 DIAGNOSIS — Z20822 Contact with and (suspected) exposure to covid-19: Secondary | ICD-10-CM

## 2020-06-19 LAB — SARS-COV-2, NAA 2 DAY TAT

## 2020-06-19 LAB — NOVEL CORONAVIRUS, NAA: SARS-CoV-2, NAA: NOT DETECTED

## 2020-09-16 ENCOUNTER — Ambulatory Visit: Payer: Medicare Other | Admitting: Adult Health

## 2020-09-16 ENCOUNTER — Encounter: Payer: Self-pay | Admitting: Adult Health

## 2020-09-16 VITALS — BP 180/83 | HR 93 | Ht 64.0 in | Wt 194.0 lb

## 2020-09-16 DIAGNOSIS — G4733 Obstructive sleep apnea (adult) (pediatric): Secondary | ICD-10-CM | POA: Diagnosis not present

## 2020-09-16 DIAGNOSIS — Z9989 Dependence on other enabling machines and devices: Secondary | ICD-10-CM | POA: Diagnosis not present

## 2020-09-16 NOTE — Progress Notes (Addendum)
PATIENT: Laura Robles DOB: 11-24-1945  REASON FOR VISIT: follow up HISTORY FROM: patient  HISTORY OF PRESENT ILLNESS: Today 09/16/20:  Laura Robles is a 75 year old female with a history of obstructive sleep apnea on CPAP.  She returns today for follow-up.  She reports that the CPAP is working well for her.  She denies any new symptoms.  Returns today for an evaluation.    09/17/19: Laura Robles is a 75 year old female with a history of obstructive sleep apnea on CPAP.  She returns today for follow-up.  Her CPAP download indicates that she used her machine 26 out of 30 days for compliance of 87%.  She is averaging greater than 4 hours each night.  On average she uses her machine 7 hours and 21 minutes.  Her residual AHI is 1.2 on 9 cm of water with EPR 2.  Leak in the 95th percentile is 15 L/min.  Reports that the CPAP is working well.  She returns today for an evaluation  HISTORY I reviewed her CPAP compliance data from 08/13/2018 through 09/11/2018 which is a total of 30 days, during which time she used her machine 19 days with percent used days greater than 4 hours at 53%, indicating suboptimal compliance with an average usage for days on treatment of 7 hours and 2 minutes, residual AHI at goal at 0.6 per hour, leak acceptable with the 95th percentile at 13 L/m on a pressure of 9 cm with EPR of 2. In the past 90 days she has had better compliance, average usage of 7 hours and 19 minutes, percent used days greater than 4 hours at 70%. Set up date was 12/28/2016.   REVIEW OF SYSTEMS: Out of a complete 14 system review of symptoms, the patient complains only of the following symptoms, and all other reviewed systems are negative.  ESS 5 FSS 36  ALLERGIES: Allergies  Allergen Reactions  . Ibuprofen     Pain in stomach/nausea  . Penicillins     Pain in stomach    HOME MEDICATIONS: Outpatient Medications Prior to Visit  Medication Sig Dispense Refill  . Acetaminophen (TYLENOL  ARTHRITIS PAIN PO) Take by mouth.    . Cholecalciferol (VITAMIN D3) 125 MCG (5000 UT) CAPS Take by mouth. One daily    . Cyanocobalamin (VITAMIN B-12 PO) Take by mouth. 2500 mg daily    . diclofenac (VOLTAREN) 75 MG EC tablet Take 75 mg by mouth 2 (two) times daily.    Marland Kitchen lisinopril (ZESTRIL) 20 MG tablet Take 40 mg by mouth daily.    Marland Kitchen loperamide (IMODIUM A-D) 2 MG tablet Take 2 mg by mouth 4 (four) times daily as needed for diarrhea or loose stools.    . Naproxen Sodium (ALEVE PO) Take by mouth.    . Omega-3 Fatty Acids (FISH OIL) 500 MG CAPS Take by mouth. One daily    . OVER THE COUNTER MEDICATION Equate All day pain relief-Take one daily    . vitamin E 180 MG (400 UNITS) capsule Take 400 Units by mouth daily.    Marland Kitchen tiZANidine (ZANAFLEX) 4 MG tablet Take 1 tablet (4 mg total) by mouth every 6 (six) hours as needed for muscle spasms. 30 tablet 0   No facility-administered medications prior to visit.    PAST MEDICAL HISTORY: Past Medical History:  Diagnosis Date  . Anemia    in past  . Cataracts, bilateral   . Diarrhea    chronic  . Ectopic pregnancy   .  Hypertension   . IBS (irritable bowel syndrome)   . Lactose intolerance   . OSA on CPAP 2018  . Osteoporosis   . Plantar fasciitis   . Sickle cell anemia (HCC)    has a trait  . Sleep apnea    uses c-pap  . Vitamin D deficiency     PAST SURGICAL HISTORY: Past Surgical History:  Procedure Laterality Date  . ABDOMINAL HYSTERECTOMY    . BREAST BIOPSY Left 03/31/1997   benign  . CHOLECYSTECTOMY  2008  . COLONOSCOPY  2009   negative screening  . TUBAL LIGATION      FAMILY HISTORY: Family History  Problem Relation Age of Onset  . Heart attack Father   . Stroke Father   . Diabetes Son   . Stroke Paternal Grandmother   . Breast cancer Cousin 30    SOCIAL HISTORY: Social History   Socioeconomic History  . Marital status: Single    Spouse name: Not on file  . Number of children: 2  . Years of education: Not on  file  . Highest education level: Not on file  Occupational History  . Occupation: retired  Tobacco Use  . Smoking status: Former Smoker    Types: Cigarettes    Quit date: 05/31/1983    Years since quitting: 37.3  . Smokeless tobacco: Never Used  Vaping Use  . Vaping Use: Never used  Substance and Sexual Activity  . Alcohol use: No  . Drug use: No  . Sexual activity: Not on file  Other Topics Concern  . Not on file  Social History Narrative  . Not on file   Social Determinants of Health   Financial Resource Strain: Not on file  Food Insecurity: Not on file  Transportation Needs: Not on file  Physical Activity: Not on file  Stress: Not on file  Social Connections: Not on file  Intimate Partner Violence: Not on file      PHYSICAL EXAM  Vitals:   09/16/20 1330  BP: (!) 180/83  Pulse: 93  Weight: 194 lb (88 kg)  Height: 5\' 4"  (1.626 m)   Body mass index is 33.3 kg/m.  Generalized: Well developed, in no acute distress  Chest: Lungs clear to auscultation bilaterally  Neurological examination  Mentation: Alert oriented to time, place, history taking. Follows all commands speech and language fluent Cranial nerve II-XII: Extraocular movements were full, visual field were full on confrontational test Head turning and shoulder shrug  were normal and symmetric. Motor: The motor testing reveals 5 over 5 strength of all 4 extremities. Good symmetric motor tone is noted throughout.  Sensory: Sensory testing is intact to soft touch on all 4 extremities. No evidence of extinction is noted.  Gait and station: Gait is normal.    DIAGNOSTIC DATA (LABS, IMAGING, TESTING) - I reviewed patient records, labs, notes, testing and imaging myself where available.  Lab Results  Component Value Date   WBC 4.1 08/26/2009   HGB 15.3 (H) 08/26/2009   HCT 44.7 08/26/2009   MCV 80.5 08/26/2009   PLT 292 08/26/2009      Component Value Date/Time   NA 140 08/06/2007 2138   K 4.2  08/06/2007 2138   CL 100 08/06/2007 2138   CO2 27 08/06/2007 2138   GLUCOSE 94 08/06/2007 2138   BUN 8 08/06/2007 2138   CREATININE 0.72 08/06/2007 2138   CALCIUM 9.0 08/06/2007 2138   PROT 7.2 08/06/2007 2138   ALBUMIN 4.2 08/06/2007 2138  AST 18 08/06/2007 2138   ALT 20 08/06/2007 2138   ALKPHOS 68 08/06/2007 2138   BILITOT 0.5 08/06/2007 2138   Lab Results  Component Value Date   CHOL 136 08/26/2009   HDL 54 08/26/2009   LDLCALC 50 08/26/2009   TRIG 161 (H) 08/26/2009   CHOLHDL 2.5 Ratio 08/26/2009      ASSESSMENT AND PLAN 75 y.o. year old female  has a past medical history of Anemia, Cataracts, bilateral, Diarrhea, Ectopic pregnancy, Hypertension, IBS (irritable bowel syndrome), Lactose intolerance, OSA on CPAP (2018), Osteoporosis, Plantar fasciitis, Sickle cell anemia (HCC), Sleep apnea, and Vitamin D deficiency. here with:  1. OSA on CPAP  - CPAP compliance excellent - Good treatment of AHI  - Encourage patient to use CPAP nightly and > 4 hours each night - F/U in 1 year or sooner if needed    Butch Penny, MSN, NP-C 09/16/2020, 1:36 PM [facility] [address], [address] [place], [place] [address] [phone]  I reviewed the above note and documentation by the Nurse Practitioner and agree with the history, exam, assessment and plan as outlined above. I was available for consultation. Huston Foley, MD, PhD [facility])

## 2020-09-16 NOTE — Patient Instructions (Signed)
Continue using CPAP nightly and greater than 4 hours each night °If your symptoms worsen or you develop new symptoms please let us know.  ° °

## 2020-09-29 ENCOUNTER — Other Ambulatory Visit: Payer: Medicare Other

## 2020-09-29 ENCOUNTER — Ambulatory Visit: Payer: Medicare Other | Attending: Critical Care Medicine

## 2020-09-29 DIAGNOSIS — Z20822 Contact with and (suspected) exposure to covid-19: Secondary | ICD-10-CM

## 2020-09-30 LAB — NOVEL CORONAVIRUS, NAA: SARS-CoV-2, NAA: NOT DETECTED

## 2020-11-16 ENCOUNTER — Ambulatory Visit: Payer: Medicare Other | Attending: Internal Medicine

## 2020-11-16 DIAGNOSIS — Z20822 Contact with and (suspected) exposure to covid-19: Secondary | ICD-10-CM

## 2020-11-17 LAB — NOVEL CORONAVIRUS, NAA: SARS-CoV-2, NAA: NOT DETECTED

## 2020-11-17 LAB — SARS-COV-2, NAA 2 DAY TAT

## 2020-12-15 ENCOUNTER — Ambulatory Visit: Payer: Medicare Other | Attending: Internal Medicine

## 2020-12-15 DIAGNOSIS — Z20822 Contact with and (suspected) exposure to covid-19: Secondary | ICD-10-CM

## 2020-12-16 ENCOUNTER — Ambulatory Visit: Payer: Self-pay | Admitting: *Deleted

## 2020-12-16 LAB — NOVEL CORONAVIRUS, NAA: SARS-CoV-2, NAA: DETECTED — AB

## 2020-12-16 LAB — SARS-COV-2, NAA 2 DAY TAT

## 2020-12-16 NOTE — Telephone Encounter (Signed)
Reason for Disposition  Health Information question, no triage required and triager able to answer question    Positive Covid test.   I went over the quarantine protocol, etc and answered her questions  Answer Assessment - Initial Assessment Questions 1. REASON FOR CALL or QUESTION: "What is your reason for calling today?" or "How can I best help you?" or "What question do you have that I can help answer?"     Pt was confused as to whether her Covid test was positive or negative  Protocols used: Information Only Call - No Triage-A-AH

## 2020-12-16 NOTE — Telephone Encounter (Signed)
Pt called in inquiring whether her Covid test result was positive or negative.   I let her know it was positive.   The quarantine information had been sent to her via MyChart by another nurse however I went over the guidelines, etc with her and answered her questions.  She thanked me for helping her understand her result.    "I need to go call some people I have been around".

## 2020-12-18 ENCOUNTER — Ambulatory Visit: Payer: Self-pay | Admitting: *Deleted

## 2020-12-18 NOTE — Telephone Encounter (Signed)
Reason for Disposition  [1] COVID-19 diagnosed by positive lab test (e.g., PCR, rapid self-test kit) AND [2] mild symptoms (e.g., cough, fever, others) AND [9] no complications or SOB  Answer Assessment - Initial Assessment Questions 1. COVID-19 DIAGNOSIS: "Who made your COVID-19 diagnosis?" "Was it confirmed by a positive lab test or self-test?" If not diagnosed by a doctor (or NP/PA), ask "Are there lots of cases (community spread) where you live?" Note: See public health department website, if unsure.     Home test Monday, PCR after, Wednesday positive 2. COVID-19 EXPOSURE: "Was there any known exposure to COVID before the symptoms began?" CDC Definition of close contact: within 6 feet (2 meters) for a total of 15 minutes or more over a 24-hour period.      unsure 3. ONSET: "When did the COVID-19 symptoms start?"      Saturday night sore throat, "_0 . OTHER SYMPTOMS: "Do you  have any other symptoms?"  (e.g., chills, fatigue, headache, loss of smell or taste, muscle pain, sore throat)       Sore throat but resolved 14. O2 SATURATION MONITOR:  "Do you use an oxygen saturation monitor (pulse oximeter) at home?" If Yes, ask "What is your reading (oxygen level) today?" "What is your usual oxygen saturation reading?" (e.g., 95%)       N/A  Protocols used: Coronavirus (COVID-19) Diagnosed or Suspected-A-AH

## 2020-12-18 NOTE — Telephone Encounter (Signed)
Pt reports covid positive, home test Monday, PCR resulted Wednesday. States felt better, now with increased "Stuffiness." Denies SOB, no fever, occasional dry cough. Home care advise given, reviewed guidelines for self isolation as well as symptoms that warrant an ED eval. Advised to alert PCP. Pt verbalizes understanding.

## 2020-12-24 ENCOUNTER — Ambulatory Visit: Payer: Self-pay | Admitting: *Deleted

## 2020-12-24 NOTE — Telephone Encounter (Signed)
Reason for Disposition  Health Information question, no triage required and triager able to answer question  Answer Assessment - Initial Assessment Questions 1. REASON FOR CALL or QUESTION: "What is your reason for calling today?" or "How can I best help you?" or "What question do you have that I can help answer?"     A week ago Wed. I was tested positive.   My quarantine was done on Friday. I want to do a retest.   I'm taking cold and flu medication.    I started Coriciden.  People won't come in to do work in my apartment unless I'm negative or it's been 10 days after I'm no longer considered contagious.  Protocols used: Information Only Call - No Triage-A-AH

## 2020-12-24 NOTE — Telephone Encounter (Signed)
She needs to have some work done in her apt.   The man that is supposed to do the work isn't comfortable coming to do the work unless it's been 10 days since she tests negative.   Pt wanting to know when should she retest for Covid.   She was tested positive on December 16, 2020.  Her 10 day quarantine will be over tomorrow 12/25/2020.   She does not have fever and her symptoms are much better.     I let her know she did not need to retest.   That after the 10 days as long as she doesn't have fever for 24 hrs without the use of medication and her symptoms are resolving then she is no longer contagious.   The repair man is requesting to come 10 days after her quarantine period just to be safe.    Let her know she is no longer contagious after tomorrow so what ever she and the repairman work out as far as comfort level would be up to them.  She thanked me for returning her call.

## 2021-01-07 ENCOUNTER — Other Ambulatory Visit: Payer: Self-pay | Admitting: Internal Medicine

## 2021-01-07 DIAGNOSIS — Z1231 Encounter for screening mammogram for malignant neoplasm of breast: Secondary | ICD-10-CM

## 2021-01-29 ENCOUNTER — Encounter (HOSPITAL_COMMUNITY): Payer: Self-pay | Admitting: Emergency Medicine

## 2021-01-29 ENCOUNTER — Other Ambulatory Visit: Payer: Self-pay

## 2021-01-29 ENCOUNTER — Ambulatory Visit (HOSPITAL_COMMUNITY)
Admission: EM | Admit: 2021-01-29 | Discharge: 2021-01-29 | Disposition: A | Discharge status: Home / Self Care | Payer: Medicare Other | Attending: Family Medicine | Admitting: Family Medicine

## 2021-01-29 DIAGNOSIS — B372 Candidiasis of skin and nail: Secondary | ICD-10-CM | POA: Diagnosis not present

## 2021-01-29 MED ORDER — FLUCONAZOLE 150 MG PO TABS: 150.0000 mg | ORAL_TABLET | ORAL | 0 refills | Status: DC

## 2021-01-29 MED ORDER — HIBICLENS 4 % EX LIQD: Freq: Every day | CUTANEOUS | 0 refills | Status: DC | PRN

## 2021-01-29 NOTE — ED Provider Notes (Signed)
[facility]    CSN: [id number] Arrival date & time: 01/29/21  1915      History   Chief Complaint Chief Complaint  Patient presents with   Rash    HPI Laura Robles is a 75 y.o. female.   Patient presenting today with about 12-month history of worsening itchy and burning rash to the underarms bilaterally.  Was recently seen by her primary care provider and given nystatin cream which she has been using twice daily without much benefit.  Denies drainage, fevers, chills, new products used.  Past Medical History:  Diagnosis Date   Anemia    in past   Cataracts, bilateral    Diarrhea    chronic   Ectopic pregnancy    Hypertension    IBS (irritable bowel syndrome)    Lactose intolerance    OSA on CPAP 2018   Osteoporosis    Plantar fasciitis    Sickle cell anemia (HCC)    has a trait   Sleep apnea    uses c-pap   Vitamin D deficiency    Patient Active Problem List   Diagnosis Date Noted   IBS 01/21/2008    Past Surgical History:  Procedure Laterality Date   ABDOMINAL HYSTERECTOMY     BREAST BIOPSY Left 03/31/1997   benign   CHOLECYSTECTOMY  2008   COLONOSCOPY  2009   negative screening   TUBAL LIGATION      OB History   No obstetric history on file.      Home Medications    Prior to Admission medications   Medication Sig Start Date End Date Taking? Authorizing Provider  chlorhexidine (HIBICLENS) 4 % external liquid Apply topically daily as needed. 01/29/21  Yes Particia Nearing, PA-C  fluconazole (DIFLUCAN) 150 MG tablet Take 1 tablet (150 mg total) by mouth every other day. 01/29/21  Yes Particia Nearing, PA-C  Acetaminophen (TYLENOL ARTHRITIS PAIN PO) Take by mouth.    [provider]  Cholecalciferol (VITAMIN D3) 125 MCG (5000 UT) CAPS Take by mouth. One daily    [provider]  Cyanocobalamin (VITAMIN B-12 PO) Take by mouth. 2500 mg daily    [provider]  diclofenac (VOLTAREN) 75 MG EC tablet  Take 75 mg by mouth 2 (two) times daily. 07/29/20   [provider]  lisinopril (ZESTRIL) 20 MG tablet Take 40 mg by mouth daily. 09/14/19   [provider]  loperamide (IMODIUM A-D) 2 MG tablet Take 2 mg by mouth 4 (four) times daily as needed for diarrhea or loose stools.    [provider]  Naproxen Sodium (ALEVE PO) Take by mouth.    [provider]  Omega-3 Fatty Acids (FISH OIL) 500 MG CAPS Take by mouth. One daily    [provider]  OVER THE COUNTER MEDICATION Equate All day pain relief-Take one daily    [provider]  vitamin E 180 MG (400 UNITS) capsule Take 400 Units by mouth daily.    [provider]    Family History Family History  Problem Relation Age of Onset   Heart attack Father    Stroke Father    Diabetes Son    Stroke Paternal Grandmother    Breast cancer Cousin 30    Social History Social History   Tobacco Use   Smoking status: Former    Types: Cigarettes    Quit date: 05/31/1983    Years since quitting: 37.6   Smokeless tobacco: Never  Vaping Use   Vaping Use: Never used  Substance Use Topics   Alcohol use: No   Drug use: No     Allergies   Ibuprofen and Penicillins   Review of Systems Review of Systems Per HPI  Physical Exam Triage Vital Signs ED Triage Vitals [01/29/21 1937]  Enc Vitals Group     BP (!) 182/82     Pulse Rate 86     Resp 18     Temp 98 F (36.7 C)     Temp src      SpO2 99 %     Weight      Height      Head Circumference      Peak Flow      Pain Score 10     Pain Loc      Pain Edu?      Excl. in GC?    No data found.  Updated Vital Signs BP (!) 182/82   Pulse 86   Temp 98 F (36.7 C)   Resp 18   SpO2 99%   Visual Acuity Right Eye Distance:   Left Eye Distance:   Bilateral Distance:    Right Eye Near:   Left Eye Near:    Bilateral Near:     Physical Exam Vitals and nursing note reviewed.  Constitutional:      Appearance: Normal  appearance. She is not ill-appearing.  HENT:     Head: Atraumatic.  Eyes:     Extraocular Movements: Extraocular movements intact.     Conjunctiva/sclera: Conjunctivae normal.  Cardiovascular:     Rate and Rhythm: Normal rate and regular rhythm.     Heart sounds: Normal heart sounds.  Pulmonary:     Effort: Pulmonary effort is normal.     Breath sounds: Normal breath sounds.  Musculoskeletal:        General: Normal range of motion.     Cervical back: Normal range of motion and neck supple.  Skin:    General: Skin is warm.     Findings: Rash present.     Comments: Erythematous macular macerated rash bilateral underarms, no drainage or abscess, no ulcerations  Neurological:     Mental Status: She is alert and oriented to person, place, and time.  Psychiatric:        Mood and Affect: Mood normal.        Thought Content: Thought content normal.        Judgment: Judgment normal.     UC Treatments / Results  Labs (all labs ordered are listed, but only abnormal results are displayed) Labs Reviewed - No data to display  EKG   Radiology No results found.  Procedures Procedures (including critical care time)  Medications Ordered in UC Medications - No data to display  Initial Impression / Assessment and Plan / UC Course  I have reviewed the triage vital signs and the nursing notes.  Pertinent labs & imaging results that were available during my care of the patient were reviewed by me and considered in my medical decision making (see chart for details).     Continue nystatin cream twice daily, cleaned with Hibiclens at least every other day, oral Diflucan every other day for 2 weeks.  Avoid deodorant or use a natural, unscented brand.  Follow-up with PCP if not fully resolving.  Final Clinical Impressions(s) / UC Diagnoses   Final diagnoses:  Cutaneous candidiasis   Discharge Instructions   None    ED  Prescriptions     Medication Sig Dispense Auth. Provider    fluconazole (DIFLUCAN) 150 MG tablet Take 1 tablet (150 mg total) by mouth every other day. 7 tablet Particia Nearing, [place]   chlorhexidine (HIBICLENS) 4 % external liquid Apply topically daily as needed. 120 mL Particia Nearing, [place]      PDMP not reviewed this encounter.   Particia Nearing, [place] 01/29/21 2003

## 2021-01-29 NOTE — ED Triage Notes (Signed)
Pt is present today with a rash under both arms. Pt states that she was nystatin cream by provider and states that she is still experiencing discomfort. Pt states that her rash started two months ago.

## 2021-02-08 ENCOUNTER — Telehealth: Payer: Self-pay | Admitting: Adult Health

## 2021-02-08 NOTE — Telephone Encounter (Signed)
FYI- Pt going out of town Sept. 23-24. States she will be off of her CPAP machine. Wanting to make sure that will be okay considering she will be flying and cannot take the machine with her.

## 2021-02-08 NOTE — Telephone Encounter (Signed)
I called patient back she is going on a Engineer, [employer] and there is some concern regarding taking her CPAP and the cost ,if its an additional charge she will not be able to take it so she will be not using her CPAP for 4 days but will restart when she returns.  this is just an [place].

## 2021-03-02 ENCOUNTER — Ambulatory Visit
Admission: RE | Admit: 2021-03-02 | Discharge: 2021-03-02 | Disposition: A | Discharge status: Home / Self Care | Payer: Medicare Other | Source: Ambulatory Visit | Attending: Internal Medicine | Admitting: Internal Medicine

## 2021-03-02 ENCOUNTER — Other Ambulatory Visit: Payer: Self-pay

## 2021-03-02 DIAGNOSIS — Z1231 Encounter for screening mammogram for malignant neoplasm of breast: Secondary | ICD-10-CM

## 2021-08-16 ENCOUNTER — Ambulatory Visit (INDEPENDENT_AMBULATORY_CARE_PROVIDER_SITE_OTHER): Payer: Medicare Other | Admitting: Orthopedic Surgery

## 2021-08-16 ENCOUNTER — Other Ambulatory Visit: Payer: Self-pay

## 2021-08-16 DIAGNOSIS — M4316 Spondylolisthesis, lumbar region: Secondary | ICD-10-CM | POA: Diagnosis not present

## 2021-08-16 DIAGNOSIS — M1611 Unilateral primary osteoarthritis, right hip: Secondary | ICD-10-CM | POA: Diagnosis not present

## 2021-08-19 ENCOUNTER — Encounter: Payer: Self-pay | Admitting: Orthopedic Surgery

## 2021-08-19 NOTE — Progress Notes (Signed)
? ?Office Visit Note ?  ?Patient: Laura Robles           ?Date of Birth: 01/13/1946           ?MRN: [id number] ?Visit Date: 08/16/2021 ?Requested by: Alysia Penna, MD ?[address] ?[place],  [place] [address] ?PCP: Alysia Penna, MD ? ?Subjective: ?Chief Complaint  ?Patient presents with  ? Right Hip - Pain  ? Lower Back - Pain  ? ? ?HPI: Laura Robles is a 76 y.o. female who presents to the office complaining of back and hip pain.  Pain has been present for 1 year without any known injury.  She is a retired Lawyer.  She mostly complains of groin pain and pain that radiates down her leg to her ankle.  She endorses low back pain, thigh pain, buttock pain.  She is unable to lay on her left side.  Vast majority of her pain is in her right leg.  No right leg weakness.  She had a hip injection that provided relief for about 1 day.  She has no history of hip or back surgery.  She is here for second opinion from Dr. Constance Goltz office.  Takes diclofenac and Tylenol as needed.  She does not take any blood thinners.  Denies any history of diabetes or smoking.  She mostly wants to discuss whether or not an MRI would be helpful for her.  She has had plain radiographs of her hip and back and these are available for review in the clinic today.  She does note particular discomfort with bending forward with reproduction of groin pain with this movement. ?             ?ROS: All systems reviewed are negative as they relate to the chief complaint within the history of present illness.  Patient denies fevers or chills. ? ?Assessment & Plan: ?Visit Diagnoses:  ?1. Unilateral primary osteoarthritis, right hip   ?2. Spondylolisthesis of lumbar region   ? ? ?Plan: Patient is a 76 year old female who presents for evaluation of right radicular pain and right groin pain.  She has had pain for about a year.  Most of her pain is in the groin and she has plain radiographs that demonstrate significant osteoarthritis of the right hip joint.   Based on examination and her history, suspect that most of the pain is coming from her hip arthritis.  However, she does have spondylolisthesis noted at L4-L5 on plain radiographs of the back with radicular pain that travels down her leg to her ankle.  Likely has some component coming from her back with this complaint.  Recommended that she discuss with Dr. Charlann Boxer and Dr. Shon Baton about ordering MRI of the lumbar spine with possible ESI's to follow after that to see how much if any of her pain is coming from her lumbar spine rather than her hip..  Do not think MRI of the pelvis would be beneficial.  She will follow-up with this office as needed and return to see Dr. Charlann Boxer and Dr. Shon Baton ? ?Follow-Up Instructions: No follow-ups on file.  ? ?Orders:  ?No orders of the defined types were placed in this encounter. ? ?No orders of the defined types were placed in this encounter. ? ? ? ? Procedures: ?No procedures performed ? ? ?Clinical Data: ?No additional findings. ? ?Objective: ?Vital Signs: There were no vitals taken for this visit. ? ?Physical Exam:  ?Constitutional: Patient appears well-developed ?HEENT:  ?Head: Normocephalic ?Eyes:EOM are normal ?Neck:  Normal range of motion ?Cardiovascular: Normal rate ?Pulmonary/chest: Effort normal ?Neurologic: Patient is alert ?Skin: Skin is warm ?Psychiatric: Patient has normal mood and affect ? ?Ortho Exam: Ortho exam demonstrates increased pain with passive hip flexion and internal rotation.  Positive Stinchfield sign.  Patient has limited passive range of motion of the right hip compared with the left.  5/5 motor strength of bilateral hip flexion, quadricep, hamstring, dorsiflexion, plantarflexion, EHL.  Negative straight leg raise.  No tenderness over the greater trochanter.  Mild tenderness throughout the axial lumbar spine. ? ?Specialty Comments:  ?No specialty comments available. ? ?Imaging: ?No results found. ? ? ?PMFS History: ?Patient Active Problem List  ? Diagnosis  Date Noted  ? IBS 01/21/2008  ? ?Past Medical History:  ?Diagnosis Date  ? Anemia   ? in past  ? Cataracts, bilateral   ? Diarrhea   ? chronic  ? Ectopic pregnancy   ? Hypertension   ? IBS (irritable bowel syndrome)   ? Lactose intolerance   ? OSA on CPAP 2018  ? Osteoporosis   ? Plantar fasciitis   ? Sickle cell anemia (HCC)   ? has a trait  ? Sleep apnea   ? uses c-pap  ? Vitamin D deficiency   ?  ?Family History  ?Problem Relation Age of Onset  ? Heart attack Father   ? Stroke Father   ? Diabetes Son   ? Stroke Paternal Grandmother   ? Breast cancer Cousin 30  ?  ?Past Surgical History:  ?Procedure Laterality Date  ? ABDOMINAL HYSTERECTOMY    ? BREAST BIOPSY Left 03/31/1997  ? benign  ? CHOLECYSTECTOMY  2008  ? COLONOSCOPY  2009  ? negative screening  ? TUBAL LIGATION    ? ?Social History  ? ?Occupational History  ? Occupation: retired  ?Tobacco Use  ? Smoking status: Former  ?  Types: Cigarettes  ?  Quit date: 05/31/1983  ?  Years since quitting: 38.2  ? Smokeless tobacco: Never  ?Vaping Use  ? Vaping Use: Never used  ?Substance and Sexual Activity  ? Alcohol use: No  ? Drug use: No  ? Sexual activity: Not on file  ? ? ? ? ?  ?

## 2021-09-06 ENCOUNTER — Telehealth: Payer: Self-pay | Admitting: Orthopedic Surgery

## 2021-09-06 NOTE — Telephone Encounter (Signed)
Pt called requesting a call back. Pt states Dr. Marlou Sa stated he would send referral for pt to have an MRI. Please call pt about this matter at [phone]. ?

## 2021-09-06 NOTE — Telephone Encounter (Signed)
Contacted patient and informed her that per her last office visit with Dr. August Saucer on 08/16/2021 that he was recommending discussing with Dr. Charlann Boxer and Dr. Shon Baton about ordering MRI of her lumbar spine with possible ESI to follow after. Patient understood and states that she would reach out those provider. No further questions to discuss. ?

## 2021-09-13 ENCOUNTER — Other Ambulatory Visit: Payer: Self-pay | Admitting: Family Medicine

## 2021-09-13 DIAGNOSIS — N644 Mastodynia: Secondary | ICD-10-CM

## 2021-09-16 ENCOUNTER — Encounter: Payer: Self-pay | Admitting: Adult Health

## 2021-09-16 ENCOUNTER — Ambulatory Visit: Payer: Medicare Other | Admitting: Adult Health

## 2021-09-16 VITALS — BP 145/81 | HR 70 | Ht 61.0 in | Wt 191.8 lb

## 2021-09-16 DIAGNOSIS — G4733 Obstructive sleep apnea (adult) (pediatric): Secondary | ICD-10-CM | POA: Diagnosis not present

## 2021-09-16 DIAGNOSIS — Z9989 Dependence on other enabling machines and devices: Secondary | ICD-10-CM

## 2021-09-16 NOTE — Patient Instructions (Signed)
Continue using CPAP nightly and greater than 4 hours each night °If your symptoms worsen or you develop new symptoms please let us know.  ° °

## 2021-09-16 NOTE — Progress Notes (Signed)
? ? ?PATIENT: Laura Robles ?DOB: 1945-12-29 ? ?REASON FOR VISIT: follow up ?HISTORY FROM: patient ?PRIMARY NEUROLOGIST: Dr. Rexene Alberts ? ?Chief Complaint  ?Patient presents with  ? Follow-up  ?  Pt in 4  pt is here for CPAP follow up   pt wanted to know when she was eligible for new CPAP . Informed patient Nov. 2023.Pt states she has dry mouth in am   ? ? ? ?HISTORY OF PRESENT ILLNESS: ?Today 09/16/21: ? ?Laura Robles is a 76 year old female with a history of obstructive sleep apnea on CPAP.  She returns today for follow-up.  She reports that the CPAP is working well for her.  She does state that she broke a piece off of her machine.  She has not taken it to her DME company. ? ? ? ? ?REVIEW OF SYSTEMS: Out of a complete 14 system review of symptoms, the patient complains only of the following symptoms, and all other reviewed systems are negative. ?  ?FSS 13 ?ESS 6 ? ?ALLERGIES: ?Allergies  ?Allergen Reactions  ? Ibuprofen   ?  Pain in stomach/nausea  ? Penicillins   ?  Pain in stomach  ? ? ?HOME MEDICATIONS: ?Outpatient Medications Prior to Visit  ?Medication Sig Dispense Refill  ? Acetaminophen (TYLENOL ARTHRITIS PAIN PO) Take by mouth.    ? chlorhexidine (HIBICLENS) 4 % external liquid Apply topically daily as needed. 120 mL 0  ? Cholecalciferol (VITAMIN D3) 125 MCG (5000 UT) CAPS Take by mouth. One daily    ? Cyanocobalamin (VITAMIN B-12 PO) Take by mouth. 2500 mg daily    ? diclofenac (VOLTAREN) 75 MG EC tablet Take 75 mg by mouth 2 (two) times daily.    ? fluconazole (DIFLUCAN) 150 MG tablet Take 1 tablet (150 mg total) by mouth every other day. 7 tablet 0  ? lisinopril (ZESTRIL) 20 MG tablet Take 40 mg by mouth daily.    ? loperamide (IMODIUM A-D) 2 MG tablet Take 2 mg by mouth 4 (four) times daily as needed for diarrhea or loose stools.    ? Naproxen Sodium (ALEVE PO) Take by mouth.    ? Omega-3 Fatty Acids (FISH OIL) 500 MG CAPS Take by mouth. One daily    ? OVER THE COUNTER MEDICATION Equate All day pain  relief-Take one daily    ? oxybutynin (DITROPAN) 5 MG tablet Take 5 mg by mouth 2 (two) times daily.    ? vitamin E 180 MG (400 UNITS) capsule Take 400 Units by mouth daily.    ? ?No facility-administered medications prior to visit.  ? ? ?PAST MEDICAL HISTORY: ?Past Medical History:  ?Diagnosis Date  ? Anemia   ? in past  ? Cataracts, bilateral   ? Diarrhea   ? chronic  ? Ectopic pregnancy   ? Hypertension   ? IBS (irritable bowel syndrome)   ? Lactose intolerance   ? OSA on CPAP 2018  ? Osteoporosis   ? Plantar fasciitis   ? Sickle cell anemia (HCC)   ? has a trait  ? Sleep apnea   ? uses c-pap  ? Vitamin D deficiency   ? ? ?PAST SURGICAL HISTORY: ?Past Surgical History:  ?Procedure Laterality Date  ? ABDOMINAL HYSTERECTOMY    ? BREAST BIOPSY Left 03/31/1997  ? benign  ? CHOLECYSTECTOMY  2008  ? COLONOSCOPY  2009  ? negative screening  ? TUBAL LIGATION    ? ? ?FAMILY HISTORY: ?Family History  ?Problem Relation Age of Onset  ?  Heart attack Father   ? Stroke Father   ? Stroke Paternal Grandmother   ? Diabetes Son   ? Breast cancer Cousin 30  ? Sleep apnea Cousin   ? ? ?SOCIAL HISTORY: ?Social History  ? ?Socioeconomic History  ? Marital status: Single  ?  Spouse name: Not on file  ? Number of children: 2  ? Years of education: Not on file  ? Highest education level: Not on file  ?Occupational History  ? Occupation: retired  ?Tobacco Use  ? Smoking status: Former  ?  Types: Cigarettes  ?  Quit date: 05/31/1983  ?  Years since quitting: 38.3  ? Smokeless tobacco: Never  ?Vaping Use  ? Vaping Use: Never used  ?Substance and Sexual Activity  ? Alcohol use: No  ? Drug use: No  ? Sexual activity: Not on file  ?Other Topics Concern  ? Not on file  ?Social History Narrative  ? Not on file  ? ?Social Determinants of Health  ? ?Financial Resource Strain: Not on file  ?Food Insecurity: Not on file  ?Transportation Needs: Not on file  ?Physical Activity: Not on file  ?Stress: Not on file  ?Social Connections: Not on file   ?Intimate Partner Violence: Not on file  ? ? ? ? ?PHYSICAL EXAM ? ?Vitals:  ? 09/16/21 1248  ?BP: (!) 145/81  ?Pulse: 70  ?Weight: 191 lb 12.8 oz (87 kg)  ?Height: 5\' 1"  (1.549 m)  ? ?Body mass index is 36.24 kg/m?. ? ?Generalized: Well developed, in no acute distress  ?Chest: Lungs clear to auscultation bilaterally ? ?Neurological examination  ?Mentation: Alert oriented to time, place, history taking. Follows all commands speech and language fluent ?Cranial nerve II-XII: Extraocular movements were full, visual field were full on confrontational test Head turning and shoulder shrug  were normal and symmetric. ?Motor: The motor testing reveals 5 over 5 strength of all 4 extremities. Good symmetric motor tone is noted throughout.  ? ? ? ?DIAGNOSTIC DATA (LABS, IMAGING, TESTING) ?- I reviewed patient records, labs, notes, testing and imaging myself where available. ? ?Lab Results  ?Component Value Date  ? WBC 4.1 08/26/2009  ? HGB 15.3 (H) 08/26/2009  ? HCT 44.7 08/26/2009  ? MCV 80.5 08/26/2009  ? PLT 292 08/26/2009  ? ?   ?Component Value Date/Time  ? NA 140 08/06/2007 2138  ? K 4.2 08/06/2007 2138  ? CL 100 08/06/2007 2138  ? CO2 27 08/06/2007 2138  ? GLUCOSE 94 08/06/2007 2138  ? BUN 8 08/06/2007 2138  ? CREATININE 0.72 08/06/2007 2138  ? CALCIUM 9.0 08/06/2007 2138  ? PROT 7.2 08/06/2007 2138  ? ALBUMIN 4.2 08/06/2007 2138  ? AST 18 08/06/2007 2138  ? ALT 20 08/06/2007 2138  ? ALKPHOS 68 08/06/2007 2138  ? BILITOT 0.5 08/06/2007 2138  ? ?Lab Results  ?Component Value Date  ? CHOL 136 08/26/2009  ? HDL 54 08/26/2009  ? Forbes 50 08/26/2009  ? TRIG 161 (H) 08/26/2009  ? CHOLHDL 2.5 Ratio 08/26/2009  ? ? ? ?ASSESSMENT AND PLAN ?76 y.o. year old female  has a past medical history of Anemia, Cataracts, bilateral, Diarrhea, Ectopic pregnancy, Hypertension, IBS (irritable bowel syndrome), Lactose intolerance, OSA on CPAP (2018), Osteoporosis, Plantar fasciitis, Sickle cell anemia ([place]), Sleep apnea, and Vitamin D  deficiency. here with: ? ?OSA on CPAP ? ?- CPAP compliance excellent ?- Good treatment of AHI  ?- Encourage patient to use CPAP nightly and > 4 hours each night ?-Advised her  to take her machine to her DME company for evaluation ?- F/U in 1 year or sooner if needed ? ? ? ?Ward Givens, MSN, NP-C 09/16/2021, 1:04 PM ?[facility] ?[address], [address] ?[place], [place] [address] ?[phone] ? ?

## 2021-09-22 ENCOUNTER — Other Ambulatory Visit: Payer: Medicare Other

## 2021-09-23 ENCOUNTER — Ambulatory Visit: Payer: Medicare Other

## 2021-09-23 ENCOUNTER — Ambulatory Visit
Admission: RE | Admit: 2021-09-23 | Discharge: 2021-09-23 | Disposition: A | Discharge status: Home / Self Care | Payer: Medicare Other | Source: Ambulatory Visit | Attending: Family Medicine | Admitting: Family Medicine

## 2021-09-23 DIAGNOSIS — N644 Mastodynia: Secondary | ICD-10-CM

## 2022-01-05 NOTE — Patient Instructions (Signed)
DUE TO COVID-19 ONLY TWO VISITORS  (aged 75 and older)  ARE ALLOWED TO COME WITH YOU AND STAY IN THE WAITING ROOM ONLY DURING PRE OP AND PROCEDURE.   **NO VISITORS ARE ALLOWED IN THE SHORT STAY AREA OR RECOVERY ROOM!!**  IF YOU WILL BE ADMITTED INTO THE HOSPITAL YOU ARE ALLOWED ONLY FOUR SUPPORT PEOPLE DURING VISITATION HOURS ONLY (7 AM -8PM)   The support person(s) must pass our screening, gel in and out, and wear a mask at all times, including in the patient's room. Patients must also wear a mask when staff or their support person are in the room. Visitors GUEST BADGE MUST BE WORN VISIBLY  One adult visitor may remain with you overnight and MUST be in the room by 8 P.M.     Your procedure is scheduled on: Tuesday 01/18/22   Report to [facility] Main Entrance    Report to admitting at 6:10 AM   Call this number if you have problems the morning of surgery [phone]   Do not eat food :After Midnight.   After Midnight you may have the following liquids until 5:40_ AM/ DAY OF SURGERY  Water Black Coffee (sugar ok, NO MILK/CREAM OR CREAMERS)  Tea (sugar ok, NO MILK/CREAM OR CREAMERS) regular and decaf                             Plain Jell-O (NO RED)                                           Fruit ices (not with fruit pulp, NO RED)                                     Popsicles (NO RED)                                                                  Juice: apple, WHITE grape, WHITE cranberry Sports drinks like Gatorade (NO RED)                    The day of surgery:  Drink ONE (1) Pre-Surgery Clear Ensure  at 5:20 AM the morning of surgery. Drink in one sitting. Do not sip.  This drink was given to you during your hospital pre-op appointment visit. Nothing else to drink after completing the Pre-Surgery Clear Ensure at 5:40 AM          If you have questions, please contact your surgeon's office.    Oral Hygiene is also important to reduce your risk of infection.                                     Remember - BRUSH YOUR TEETH THE MORNING OF SURGERY WITH YOUR REGULAR TOOTHPASTE    Take these medicines the morning of surgery with A SIP OF WATER: Ditropan, use your eye drops  Bring CPAP mask and tubing day of surgery.  You may not have any metal on your body including hair pins, jewelry, and body piercing             Do not wear make-up, lotions, powders, perfumes, or deodorant  Do not wear nail polish including gel and S&S, artificial/acrylic nails, or any other type of covering on natural nails including finger and toenails. If you have artificial nails, gel coating, etc. that needs to be removed by a nail salon please have this removed prior to surgery or surgery may need to be canceled/ delayed if the surgeon/ anesthesia feels like they are unable to be safely monitored.   Do not shave  48 hours prior to surgery.     Do not bring valuables to the hospital. [facility] IS NOT RESPONSIBLE   FOR VALUABLES.   Bring small overnight bag day of surgery.   DO NOT BRING YOUR HOME MEDICATIONS TO THE HOSPITAL. PHARMACY WILL DISPENSE MEDICATIONS LISTED ON YOUR MEDICATION LIST TO YOU DURING YOUR ADMISSION IN THE HOSPITAL!    Special Instructions: Bring a copy of your healthcare power of attorney and living will documents  the day of surgery if you haven't scanned them before.              Please read over the following fact sheets you were given: IF YOU HAVE QUESTIONS ABOUT YOUR PRE-OP INSTRUCTIONS PLEASE CALL [phone]     [facility] - Preparing for Surgery Before surgery, you can play an important role.  Because skin is not sterile, your skin needs to be as free of germs as possible.  You can reduce the number of germs on your skin by washing with CHG (chlorahexidine gluconate) soap before surgery.  CHG is an antiseptic cleaner which kills germs and bonds with the skin to continue killing germs even after washing. Please DO NOT  use if you have an allergy to CHG or antibacterial soaps.  If your skin becomes reddened/irritated stop using the CHG and inform your nurse when you arrive at Short Stay. Do not shave (including legs and underarms) for at least 48 hours prior to the first CHG shower.  Please follow these instructions carefully:  1.  Shower with CHG Soap the night before surgery and the  morning of Surgery.  2.  If you choose to wash your hair, wash your hair first as usual with your  normal  shampoo.  3.  After you shampoo, rinse your hair and body thoroughly to remove the  shampoo.                            4.  Use CHG as you would any other liquid soap.  You can apply chg directly  to the skin and wash                       Gently with a scrungie or clean washcloth.  5.  Apply the CHG Soap to your body ONLY FROM THE NECK DOWN.   Do not use on face/ open                           Wound or open sores. Avoid contact with eyes, ears mouth and genitals (private parts).                       Wash face,  Genitals (private parts) with  your normal soap.             6.  Wash thoroughly, paying special attention to the area where your surgery  will be performed.  7.  Thoroughly rinse your body with warm water from the neck down.  8.  DO NOT shower/wash with your normal soap after using and rinsing off  the CHG Soap.                9.  Pat yourself dry with a clean towel.            10.  Wear clean pajamas.            11.  Place clean sheets on your bed the night of your first shower and do not  sleep with pets. Day of Surgery : Do not apply any lotions/deodorants the morning of surgery.  Please wear clean clothes to the hospital/surgery center.  FAILURE TO FOLLOW THESE INSTRUCTIONS MAY RESULT IN THE CANCELLATION OF YOUR SURGERY   ________________________________________________________________________   Incentive Spirometer  An incentive spirometer is a tool that can help keep your lungs clear and active. This  tool measures how well you are filling your lungs with each breath. Taking long deep breaths may help reverse or decrease the chance of developing breathing (pulmonary) problems (especially infection) following: A long period of time when you are unable to move or be active. BEFORE THE PROCEDURE  If the spirometer includes an indicator to show your best effort, your nurse or respiratory therapist will set it to a desired goal. If possible, sit up straight or lean slightly forward. Try not to slouch. Hold the incentive spirometer in an upright position. INSTRUCTIONS FOR USE  Sit on the edge of your bed if possible, or sit up as far as you can in bed or on a chair. Hold the incentive spirometer in an upright position. Breathe out normally. Place the mouthpiece in your mouth and seal your lips tightly around it. Breathe in slowly and as deeply as possible, raising the piston or the ball toward the top of the column. Hold your breath for 3-5 seconds or for as long as possible. Allow the piston or ball to fall to the bottom of the column. Remove the mouthpiece from your mouth and breathe out normally. Rest for a few seconds and repeat Steps 1 through 7 at least 10 times every 1-2 hours when you are awake. Take your time and take a few normal breaths between deep breaths. The spirometer may include an indicator to show your best effort. Use the indicator as a goal to work toward during each repetition. After each set of 10 deep breaths, practice coughing to be sure your lungs are clear. If you have an incision (the cut made at the time of surgery), support your incision when coughing by placing a pillow or rolled up towels firmly against it. Once you are able to get out of bed, walk around indoors and cough well. You may stop using the incentive spirometer when instructed by your caregiver.  RISKS AND COMPLICATIONS Take your time so you do not get dizzy or light-headed. If you are in pain, you may need  to take or ask for pain medication before doing incentive spirometry. It is harder to take a deep breath if you are having pain. AFTER USE Rest and breathe slowly and easily. It can be helpful to keep track of a log of your progress. Your caregiver can provide you  with a simple table to help with this. If you are using the spirometer at home, follow these instructions: [place] IF:  You are having difficultly using the spirometer. You have trouble using the spirometer as often as instructed. Your pain medication is not giving enough relief while using the spirometer. You develop fever of 100.5 F (38.1 C) or higher. SEEK IMMEDIATE MEDICAL CARE IF:  You cough up bloody sputum that had not been present before. You develop fever of 102 F (38.9 C) or greater. You develop worsening pain at or near the incision site. MAKE SURE YOU:  Understand these instructions. Will watch your condition. Will get help right away if you are not doing well or get worse. Document Released: 09/26/2006 Document Revised: 08/08/2011 Document Reviewed: 11/27/2006 [facility] Patient Information 2014 [place], [place].   ________________________________________________________________________

## 2022-01-06 ENCOUNTER — Other Ambulatory Visit: Payer: Self-pay

## 2022-01-06 ENCOUNTER — Encounter (HOSPITAL_COMMUNITY): Payer: Self-pay

## 2022-01-06 ENCOUNTER — Encounter (HOSPITAL_COMMUNITY)
Admission: RE | Admit: 2022-01-06 | Discharge: 2022-01-06 | Disposition: A | Discharge status: Home / Self Care | Payer: Medicare Other | Source: Ambulatory Visit | Attending: Orthopedic Surgery | Admitting: Orthopedic Surgery

## 2022-01-06 VITALS — BP 136/73 | HR 83 | Temp 97.9°F | Resp 16 | Ht 62.0 in | Wt 185.0 lb

## 2022-01-06 DIAGNOSIS — I10 Essential (primary) hypertension: Secondary | ICD-10-CM | POA: Insufficient documentation

## 2022-01-06 DIAGNOSIS — Z01818 Encounter for other preprocedural examination: Secondary | ICD-10-CM | POA: Diagnosis present

## 2022-01-06 DIAGNOSIS — M1611 Unilateral primary osteoarthritis, right hip: Secondary | ICD-10-CM | POA: Insufficient documentation

## 2022-01-06 HISTORY — DX: Unspecified osteoarthritis, unspecified site: M19.90

## 2022-01-06 HISTORY — DX: Gastro-esophageal reflux disease without esophagitis: K21.9

## 2022-01-06 LAB — CBC
HCT: 39.1 % (ref 36.0–46.0)
Hemoglobin: 13.1 g/dL (ref 12.0–15.0)
MCH: 27.6 pg (ref 26.0–34.0)
MCHC: 33.5 g/dL (ref 30.0–36.0)
MCV: 82.3 fL (ref 80.0–100.0)
Platelets: 276 10*3/uL (ref 150–400)
RBC: 4.75 MIL/uL (ref 3.87–5.11)
RDW: 13.3 % (ref 11.5–15.5)
WBC: 5.1 10*3/uL (ref 4.0–10.5)
nRBC: 0 % (ref 0.0–0.2)

## 2022-01-06 LAB — TYPE AND SCREEN
ABO/RH(D): O POS
Antibody Screen: NEGATIVE

## 2022-01-06 LAB — SURGICAL PCR SCREEN
MRSA, PCR: NEGATIVE
Staphylococcus aureus: NEGATIVE

## 2022-01-06 LAB — BASIC METABOLIC PANEL
Anion gap: 6 (ref 5–15)
BUN: 13 mg/dL (ref 8–23)
CO2: 26 mmol/L (ref 22–32)
Calcium: 9 mg/dL (ref 8.9–10.3)
Chloride: 107 mmol/L (ref 98–111)
Creatinine, Ser: 0.83 mg/dL (ref 0.44–1.00)
GFR, Estimated: >60 mL/min (ref >60)
Glucose, Bld: 85 mg/dL (ref 70–99)
Potassium: 4.7 mmol/L (ref 3.5–5.1)
Sodium: 139 mmol/L (ref 135–145)

## 2022-01-06 NOTE — Progress Notes (Signed)
COVID Vaccine received:  []  No [x]  Yes Date of any COVID positive Test in last 90 days: None  PCP - , MD Medical Clearance on chart Cardiologist - Orthopedic- Dr.  Chest x-ray -  EKG -  2020   repeated 01-06-22 at PST Stress Test - N/A ECHO - N/A Cardiac Cath - N/A  Pacemaker/ICD device     [x]  N/A Spinal Cord Stimulator:[x]  No []  Yes   Other Implants:   History of Sleep Apnea? []  No [x]  Yes   Sleep Study Date:  2018 CPAP used?- []  No [x]  Yes  (Instruct to bring their mask & Tubing)  Does the patient monitor blood sugar? []  No []  Yes  [x]  N/A  Blood Thinner Instructions: None Aspirin Instructions: none Last Dose:  ERAS Protocol Ordered: []  No  [x]  Yes PRE-SURGERY [x]  ENSURE  []  G2   Comments:   Activity level: Patient can not climb a flight of stairs without difficulty; [x]  No CP   but would have  SOB_   Anesthesia review: HTN, IBS, sickle-cell trait,   Patient denies shortness of breath, fever, cough and chest pain at PAT appointment.  Patient verbalized understanding and agreement to the Pre-Surgical Instructions that were given to them at this PAT appointment. Patient was also educated of the need to review these PAT instructions again prior to his/her surgery.I reviewed the appropriate phone numbers to call if they have any and questions or concerns.

## 2022-01-17 ENCOUNTER — Encounter (HOSPITAL_COMMUNITY): Payer: Self-pay | Admitting: Orthopedic Surgery

## 2022-01-17 NOTE — H&P (Signed)
TOTAL HIP ADMISSION H&P  Patient is admitted for right total hip arthroplasty.  Subjective:  Chief Complaint: right hip pain  HPI: Laura Robles, 76 y.o. female, has a history of pain and functional disability in the right hip(s) due to arthritis and patient has failed non-surgical conservative treatments for greater than 12 weeks to include NSAID's and/or analgesics and activity modification.  Onset of symptoms was gradual starting 2 years ago with gradually worsening course since that time.The patient noted no past surgery on the right hip(s).  Patient currently rates pain in the right hip at 2 out of 10 with activity. Patient has worsening of pain with activity and weight bearing, pain that interfers with activities of daily living, and pain with passive range of motion. Patient has evidence of joint space narrowing by imaging studies. This condition presents safety issues increasing the risk of falls.  There is no current active infection.  Patient Active Problem List   Diagnosis Date Noted   IBS 01/21/2008   Past Medical History:  Diagnosis Date   Anemia    in past   Arthritis    Cataracts, bilateral    Diarrhea    chronic   Ectopic pregnancy    GERD (gastroesophageal reflux disease)    Hypertension    IBS (irritable bowel syndrome)    Lactose intolerance    OSA on CPAP 2018   Osteoporosis    Plantar fasciitis    Sickle cell anemia (HCC)    has a trait   Sleep apnea    uses c-pap   Vitamin D deficiency     Past Surgical History:  Procedure Laterality Date   ABDOMINAL HYSTERECTOMY     BREAST BIOPSY Left 03/31/1997   benign   CHOLECYSTECTOMY  2008   COLONOSCOPY  2009   negative screening   TUBAL LIGATION      No current facility-administered medications for this encounter.   Current Outpatient Medications  Medication Sig Dispense Refill Last Dose   acetaminophen (TYLENOL) 500 MG tablet Take 1,000 mg by mouth every 8 (eight) hours as needed for moderate pain.       ascorbic acid (VITAMIN C) 500 MG tablet Take 500 mg by mouth daily.      Calcium Carb-Cholecalciferol (CALCIUM 600/VITAMIN D3) 600-20 MG-MCG TABS Take 1 tablet by mouth daily.      Cholecalciferol (VITAMIN D3) 50 MCG (2000 UT) capsule Take 2,000 Units by mouth daily.      Cyanocobalamin (VITAMIN B-12 PO) Take 2,500 mcg by mouth daily.      diclofenac (VOLTAREN) 75 MG EC tablet Take 75 mg by mouth 2 (two) times daily.      latanoprost (XALATAN) 0.005 % ophthalmic solution Place 1 drop into both eyes at bedtime.      lisinopril (ZESTRIL) 40 MG tablet Take 40 mg by mouth daily.      loperamide (IMODIUM A-D) 2 MG tablet Take 2 mg by mouth 4 (four) times daily as needed for diarrhea or loose stools.      Omega-3 Fatty Acids (FISH OIL) 1000 MG CAPS Take 1,000 mg by mouth daily.      oxybutynin (DITROPAN) 5 MG tablet Take 5 mg by mouth 2 (two) times daily.      triamcinolone cream (KENALOG) 0.1 % Apply 1 Application topically 2 (two) times daily as needed (skin irritation).      vitamin E 200 UNIT capsule Take 200 Units by mouth daily.      Allergies  Allergen  Reactions   Ibuprofen     Pain in stomach/nausea   Penicillins     Pain in stomach    Social History   Tobacco Use   Smoking status: Former    Types: Cigarettes    Quit date: 05/31/1983    Years since quitting: 38.6   Smokeless tobacco: Never  Substance Use Topics   Alcohol use: No    Family History  Problem Relation Age of Onset   Heart attack Father    Stroke Father    Stroke Paternal Grandmother    Diabetes Son    Breast cancer Cousin 30   Sleep apnea Cousin      Review of Systems  Constitutional:  Negative for chills and fever.  Respiratory:  Negative for cough and shortness of breath.   Cardiovascular:  Negative for chest pain.  Gastrointestinal:  Negative for nausea and vomiting.  Musculoskeletal:  Positive for arthralgias.     Objective:  Physical Exam Well nourished and well developed. General: Alert and  oriented x3, cooperative and pleasant, no acute distress. Head: normocephalic, atraumatic, neck supple. Eyes: EOMI.  Musculoskeletal: Right hip exam: Painful and limited hip flexion internal rotation to just 5 degrees with pelvic tilting and pain with external rotation to 20 degrees Active hip flexion with 5 -/5 strength due to discomfort with slight external rotation contracture noted No lower extremity edema, erythema or calf tenderness  Calves soft and nontender. Motor function intact in LE. Strength 5/5 LE bilaterally. Neuro: Distal pulses 2+. Sensation to light touch intact in LE.  Vital signs in last 24 hours:    Labs:   Estimated body mass index is 33.84 kg/m as calculated from the following:   Height as of 01/06/22: 5\' 2"  (1.575 m).   Weight as of 01/06/22: 83.9 kg.   Imaging Review Plain radiographs demonstrate severe degenerative joint disease of the right hip(s). The bone quality appears to be adequate for age and reported activity level.      Assessment/Plan:  End stage arthritis, right hip(s)  The patient history, physical examination, clinical judgement of the provider and imaging studies are consistent with end stage degenerative joint disease of the right hip(s) and total hip arthroplasty is deemed medically necessary. The treatment options including medical management, injection therapy, arthroscopy and arthroplasty were discussed at length. The risks and benefits of total hip arthroplasty were presented and reviewed. The risks due to aseptic loosening, infection, stiffness, dislocation/subluxation,  thromboembolic complications and other imponderables were discussed.  The patient acknowledged the explanation, agreed to proceed with the plan and consent was signed. Patient is being admitted for inpatient treatment for surgery, pain control, PT, OT, prophylactic antibiotics, VTE prophylaxis, progressive ambulation and ADL's and discharge planning.The patient is  planning to be discharged  home.   Therapy Plans: HEP Disposition: Home with friend (staying at her house) Planned DVT Prophylaxis: aspirin 81mg  BID DME needed: walker PCP: Dr. 03/08/22, clearance received TXA: IV Allergies: PCN - stomach issues, tramadol - stomach issues, ibuprofen - stomach irritation Anesthesia Concerns: none BMI: 31.9 Last HgbA1c: Not diabetic   Other: - celebrex, tylenol, hydrocodone  , PA-C Orthopedic Surgery [facility] [place] [phone]

## 2022-01-17 NOTE — Anesthesia Preprocedure Evaluation (Signed)
Anesthesia Evaluation  Patient identified by MRN, date of birth, ID band Patient awake    Reviewed: Allergy & Precautions, NPO status , Patient's Chart, lab work & pertinent test results  History of Anesthesia Complications Negative for: history of anesthetic complications  Airway Mallampati: I  TM Distance: >3 FB Neck ROM: Full    Dental  (+) Poor Dentition, Dental Advisory Given, Missing, Chipped   Pulmonary sleep apnea and Continuous Positive Airway Pressure Ventilation , former smoker,    breath sounds clear to auscultation       Cardiovascular hypertension, Pt. on medications (-) angina Rhythm:Regular Rate:Normal     Neuro/Psych negative neurological ROS  negative psych ROS   GI/Hepatic Neg liver ROS, GERD  Controlled,  Endo/Other  Morbid obesity  Renal/GU negative Renal ROS     Musculoskeletal  (+) Arthritis ,   Abdominal (+) + obese,   Peds  Hematology negative hematology ROS (+)   Anesthesia Other Findings   Reproductive/Obstetrics                            Anesthesia Physical Anesthesia Plan  ASA: 3  Anesthesia Plan: Spinal   Post-op Pain Management: Tylenol PO (pre-op)*   Induction:   PONV Risk Score and Plan: 2 and Ondansetron and Treatment may vary due to age or medical condition  Airway Management Planned: Natural Airway and Simple Face Mask  Additional Equipment: None  Intra-op Plan:   Post-operative Plan:   Informed Consent: I have reviewed the patients History and Physical, chart, labs and discussed the procedure including the risks, benefits and alternatives for the proposed anesthesia with the patient or authorized representative who has indicated his/her understanding and acceptance.     Dental advisory given  Plan Discussed with: CRNA and Surgeon  Anesthesia Plan Comments:        Anesthesia Quick Evaluation

## 2022-01-18 ENCOUNTER — Encounter (HOSPITAL_COMMUNITY): Payer: Self-pay | Admitting: Orthopedic Surgery

## 2022-01-18 ENCOUNTER — Ambulatory Visit (HOSPITAL_BASED_OUTPATIENT_CLINIC_OR_DEPARTMENT_OTHER): Payer: Medicare Other | Admitting: Anesthesiology

## 2022-01-18 ENCOUNTER — Observation Stay (HOSPITAL_COMMUNITY)
Admission: RE | Admit: 2022-01-18 | Discharge: 2022-01-20 | Disposition: A | Discharge status: Home / Self Care | Payer: Medicare Other | Attending: Orthopedic Surgery | Admitting: Orthopedic Surgery

## 2022-01-18 ENCOUNTER — Ambulatory Visit (HOSPITAL_COMMUNITY): Payer: Medicare Other

## 2022-01-18 ENCOUNTER — Ambulatory Visit (HOSPITAL_COMMUNITY): Payer: Medicare Other | Admitting: Anesthesiology

## 2022-01-18 ENCOUNTER — Other Ambulatory Visit: Payer: Self-pay

## 2022-01-18 ENCOUNTER — Encounter (HOSPITAL_COMMUNITY)
Admission: RE | Disposition: A | Discharge status: Home / Self Care | Payer: Self-pay | Source: Home / Self Care | Attending: Orthopedic Surgery

## 2022-01-18 DIAGNOSIS — Z96641 Presence of right artificial hip joint: Secondary | ICD-10-CM

## 2022-01-18 DIAGNOSIS — G4733 Obstructive sleep apnea (adult) (pediatric): Secondary | ICD-10-CM

## 2022-01-18 DIAGNOSIS — M1611 Unilateral primary osteoarthritis, right hip: Secondary | ICD-10-CM

## 2022-01-18 DIAGNOSIS — Z9989 Dependence on other enabling machines and devices: Secondary | ICD-10-CM

## 2022-01-18 DIAGNOSIS — I10 Essential (primary) hypertension: Secondary | ICD-10-CM | POA: Insufficient documentation

## 2022-01-18 DIAGNOSIS — Z87891 Personal history of nicotine dependence: Secondary | ICD-10-CM

## 2022-01-18 HISTORY — PX: TOTAL HIP ARTHROPLASTY: SHX124

## 2022-01-18 LAB — ABO/RH: ABO/RH(D): O POS

## 2022-01-18 SURGERY — ARTHROPLASTY, HIP, TOTAL, ANTERIOR APPROACH
Anesthesia: Spinal | Site: Hip | Laterality: Right

## 2022-01-18 MED ORDER — ONDANSETRON HCL 4 MG/2ML IJ SOLN
INTRAMUSCULAR | Status: AC
  Filled 2022-01-18: qty 4

## 2022-01-18 MED ORDER — PROPOFOL 1000 MG/100ML IV EMUL
INTRAVENOUS | Status: AC
  Filled 2022-01-18: qty 100

## 2022-01-18 MED ORDER — DEXAMETHASONE SODIUM PHOSPHATE 10 MG/ML IJ SOLN: 8.0000 mg | Freq: Once | INTRAMUSCULAR | Status: AC

## 2022-01-18 MED ORDER — CEFAZOLIN SODIUM-DEXTROSE 2-4 GM/100ML-% IV SOLN
2.0000 g | Freq: Four times a day (QID) | INTRAVENOUS | Status: AC
  Administered 2022-01-18 (×2): 2 g via INTRAVENOUS
  Filled 2022-01-18 (×2): qty 100

## 2022-01-18 MED ORDER — MEPERIDINE HCL 50 MG/ML IJ SOLN: 6.2500 mg | INTRAMUSCULAR | Status: DC | PRN

## 2022-01-18 MED ORDER — LATANOPROST 0.005 % OP SOLN
1.0000 [drp] | Freq: Every day | OPHTHALMIC | Status: DC
  Administered 2022-01-19: 1 [drp] via OPHTHALMIC
  Filled 2022-01-18: qty 2.5

## 2022-01-18 MED ORDER — METOCLOPRAMIDE HCL 5 MG PO TABS
5.0000 mg | ORAL_TABLET | Freq: Three times a day (TID) | ORAL | Status: DC | PRN
  Administered 2022-01-19 (×2): 10 mg via ORAL
  Filled 2022-01-18 (×2): qty 2

## 2022-01-18 MED ORDER — 0.9 % SODIUM CHLORIDE (POUR BTL) OPTIME
TOPICAL | Status: DC | PRN
  Administered 2022-01-18: 1000 mL

## 2022-01-18 MED ORDER — ASPIRIN 81 MG PO CHEW
81.0000 mg | CHEWABLE_TABLET | Freq: Two times a day (BID) | ORAL | Status: DC
  Administered 2022-01-18 – 2022-01-20 (×4): 81 mg via ORAL
  Filled 2022-01-18 (×4): qty 1

## 2022-01-18 MED ORDER — OXYCODONE HCL 5 MG PO TABS
5.0000 mg | ORAL_TABLET | Freq: Once | ORAL | Status: AC | PRN
  Administered 2022-01-18: 5 mg via ORAL

## 2022-01-18 MED ORDER — LISINOPRIL 20 MG PO TABS
40.0000 mg | ORAL_TABLET | Freq: Every day | ORAL | Status: DC
  Administered 2022-01-19 – 2022-01-20 (×2): 40 mg via ORAL
  Filled 2022-01-18 (×2): qty 2

## 2022-01-18 MED ORDER — OXYCODONE HCL 5 MG/5ML PO SOLN: 5.0000 mg | Freq: Once | ORAL | Status: AC | PRN

## 2022-01-18 MED ORDER — OXYCODONE HCL 5 MG PO TABS
ORAL_TABLET | ORAL | Status: AC
  Filled 2022-01-18: qty 1

## 2022-01-18 MED ORDER — POLYETHYLENE GLYCOL 3350 17 G PO PACK
17.0000 g | PACK | Freq: Every day | ORAL | Status: DC | PRN
  Administered 2022-01-20: 17 g via ORAL
  Filled 2022-01-18: qty 1

## 2022-01-18 MED ORDER — DIPHENHYDRAMINE HCL 12.5 MG/5ML PO ELIX: 12.5000 mg | ORAL_SOLUTION | ORAL | Status: DC | PRN

## 2022-01-18 MED ORDER — SODIUM CHLORIDE 0.9 % IV SOLN: INTRAVENOUS | Status: DC

## 2022-01-18 MED ORDER — PROPOFOL 10 MG/ML IV BOLUS
INTRAVENOUS | Status: AC
  Filled 2022-01-18: qty 20

## 2022-01-18 MED ORDER — HYDROCODONE-ACETAMINOPHEN 7.5-325 MG PO TABS
1.0000 | ORAL_TABLET | ORAL | Status: DC | PRN
  Administered 2022-01-18 – 2022-01-20 (×7): 2 via ORAL
  Filled 2022-01-18 (×7): qty 2

## 2022-01-18 MED ORDER — PROPOFOL 500 MG/50ML IV EMUL
INTRAVENOUS | Status: DC | PRN
  Administered 2022-01-18: 65 ug/kg/min via INTRAVENOUS

## 2022-01-18 MED ORDER — HYDROCODONE-ACETAMINOPHEN 5-325 MG PO TABS
1.0000 | ORAL_TABLET | ORAL | Status: DC | PRN
  Administered 2022-01-18: 2 via ORAL
  Filled 2022-01-18: qty 2

## 2022-01-18 MED ORDER — PHENOL 1.4 % MT LIQD: 1.0000 | OROMUCOSAL | Status: DC | PRN

## 2022-01-18 MED ORDER — POVIDONE-IODINE 10 % EX SWAB
2.0000 | Freq: Once | CUTANEOUS | Status: AC
  Administered 2022-01-18: 2 via TOPICAL

## 2022-01-18 MED ORDER — METHOCARBAMOL 500 MG PO TABS
ORAL_TABLET | ORAL | Status: AC
  Filled 2022-01-18: qty 1

## 2022-01-18 MED ORDER — DEXAMETHASONE SODIUM PHOSPHATE 10 MG/ML IJ SOLN
INTRAMUSCULAR | Status: AC
Start: 2022-01-18 — End: ?
  Filled 2022-01-18: qty 2

## 2022-01-18 MED ORDER — LIDOCAINE HCL (PF) 2 % IJ SOLN
INTRAMUSCULAR | Status: AC
  Filled 2022-01-18: qty 10

## 2022-01-18 MED ORDER — FERROUS SULFATE 325 (65 FE) MG PO TABS
325.0000 mg | ORAL_TABLET | Freq: Three times a day (TID) | ORAL | Status: DC
  Administered 2022-01-19 – 2022-01-20 (×2): 325 mg via ORAL
  Filled 2022-01-18 (×3): qty 1

## 2022-01-18 MED ORDER — LACTATED RINGERS IV SOLN: INTRAVENOUS | Status: DC

## 2022-01-18 MED ORDER — MIDAZOLAM HCL 2 MG/2ML IJ SOLN: 0.5000 mg | Freq: Once | INTRAMUSCULAR | Status: DC | PRN

## 2022-01-18 MED ORDER — DOCUSATE SODIUM 100 MG PO CAPS
100.0000 mg | ORAL_CAPSULE | Freq: Two times a day (BID) | ORAL | Status: DC
  Administered 2022-01-18 – 2022-01-20 (×4): 100 mg via ORAL
  Filled 2022-01-18 (×4): qty 1

## 2022-01-18 MED ORDER — ACETAMINOPHEN 325 MG PO TABS: 325.0000 mg | ORAL_TABLET | Freq: Four times a day (QID) | ORAL | Status: DC | PRN

## 2022-01-18 MED ORDER — BISACODYL 10 MG RE SUPP: 10.0000 mg | Freq: Every day | RECTAL | Status: DC | PRN

## 2022-01-18 MED ORDER — ONDANSETRON HCL 4 MG/2ML IJ SOLN
INTRAMUSCULAR | Status: DC | PRN
  Administered 2022-01-18: 4 mg via INTRAVENOUS

## 2022-01-18 MED ORDER — PROPOFOL 1000 MG/100ML IV EMUL
INTRAVENOUS | Status: AC
Start: 2022-01-18 — End: ?
  Filled 2022-01-18: qty 100

## 2022-01-18 MED ORDER — PROPOFOL 10 MG/ML IV BOLUS
INTRAVENOUS | Status: DC | PRN
  Administered 2022-01-18: 10 mg via INTRAVENOUS

## 2022-01-18 MED ORDER — CEFAZOLIN SODIUM-DEXTROSE 2-4 GM/100ML-% IV SOLN
2.0000 g | INTRAVENOUS | Status: AC
  Administered 2022-01-18: 2 g via INTRAVENOUS
  Filled 2022-01-18: qty 100

## 2022-01-18 MED ORDER — HYDROMORPHONE HCL 1 MG/ML IJ SOLN: 0.2500 mg | INTRAMUSCULAR | Status: DC | PRN

## 2022-01-18 MED ORDER — LIDOCAINE 2% (20 MG/ML) 5 ML SYRINGE
INTRAMUSCULAR | Status: DC | PRN
  Administered 2022-01-18: 40 mg via INTRAVENOUS

## 2022-01-18 MED ORDER — MENTHOL 3 MG MT LOZG
1.0000 | LOZENGE | OROMUCOSAL | Status: DC | PRN
Start: 2022-01-18 — End: 2022-01-20

## 2022-01-18 MED ORDER — OXYBUTYNIN CHLORIDE 5 MG PO TABS
5.0000 mg | ORAL_TABLET | Freq: Two times a day (BID) | ORAL | Status: DC
  Administered 2022-01-18 – 2022-01-20 (×4): 5 mg via ORAL
  Filled 2022-01-18 (×4): qty 1

## 2022-01-18 MED ORDER — MIDAZOLAM HCL 2 MG/2ML IJ SOLN
INTRAMUSCULAR | Status: AC
  Filled 2022-01-18: qty 2

## 2022-01-18 MED ORDER — METHOCARBAMOL 500 MG PO TABS
500.0000 mg | ORAL_TABLET | Freq: Four times a day (QID) | ORAL | Status: DC | PRN
  Administered 2022-01-18 – 2022-01-19 (×4): 500 mg via ORAL
  Filled 2022-01-18 (×3): qty 1

## 2022-01-18 MED ORDER — STERILE WATER FOR IRRIGATION IR SOLN
Status: DC | PRN
  Administered 2022-01-18: 2000 mL

## 2022-01-18 MED ORDER — ACETAMINOPHEN 500 MG PO TABS
1000.0000 mg | ORAL_TABLET | Freq: Once | ORAL | Status: AC
Start: 2022-01-18 — End: 2022-01-18
  Administered 2022-01-18: 1000 mg via ORAL
  Filled 2022-01-18: qty 2

## 2022-01-18 MED ORDER — PROMETHAZINE HCL 25 MG/ML IJ SOLN: 6.2500 mg | INTRAMUSCULAR | Status: DC | PRN

## 2022-01-18 MED ORDER — DEXAMETHASONE SODIUM PHOSPHATE 10 MG/ML IJ SOLN
10.0000 mg | Freq: Once | INTRAMUSCULAR | Status: AC
  Administered 2022-01-19: 10 mg via INTRAVENOUS
  Filled 2022-01-18: qty 1

## 2022-01-18 MED ORDER — TRANEXAMIC ACID-NACL 1000-0.7 MG/100ML-% IV SOLN
1000.0000 mg | Freq: Once | INTRAVENOUS | Status: AC
  Administered 2022-01-18: 1000 mg via INTRAVENOUS
  Filled 2022-01-18: qty 100

## 2022-01-18 MED ORDER — MORPHINE SULFATE (PF) 2 MG/ML IV SOLN
0.5000 mg | INTRAVENOUS | Status: DC | PRN
  Administered 2022-01-18 (×2): 1 mg via INTRAVENOUS
  Filled 2022-01-18 (×2): qty 1

## 2022-01-18 MED ORDER — BUPIVACAINE IN DEXTROSE 0.75-8.25 % IT SOLN
INTRATHECAL | Status: DC | PRN
  Administered 2022-01-18: 12 mg via INTRATHECAL

## 2022-01-18 MED ORDER — DEXAMETHASONE SODIUM PHOSPHATE 10 MG/ML IJ SOLN
INTRAMUSCULAR | Status: DC | PRN
  Administered 2022-01-18: 4 mg via INTRAVENOUS

## 2022-01-18 MED ORDER — CHLORHEXIDINE GLUCONATE 0.12 % MT SOLN
15.0000 mL | Freq: Once | OROMUCOSAL | Status: AC
  Administered 2022-01-18: 15 mL via OROMUCOSAL

## 2022-01-18 MED ORDER — ORAL CARE MOUTH RINSE: 15.0000 mL | Freq: Once | OROMUCOSAL | Status: AC

## 2022-01-18 MED ORDER — METOCLOPRAMIDE HCL 5 MG/ML IJ SOLN: 5.0000 mg | Freq: Three times a day (TID) | INTRAMUSCULAR | Status: DC | PRN

## 2022-01-18 MED ORDER — TRANEXAMIC ACID-NACL 1000-0.7 MG/100ML-% IV SOLN
1000.0000 mg | INTRAVENOUS | Status: AC
  Administered 2022-01-18: 1000 mg via INTRAVENOUS
  Filled 2022-01-18: qty 100

## 2022-01-18 MED ORDER — ORAL CARE MOUTH RINSE: 15.0000 mL | OROMUCOSAL | Status: DC | PRN

## 2022-01-18 MED ORDER — ONDANSETRON HCL 4 MG PO TABS
4.0000 mg | ORAL_TABLET | Freq: Four times a day (QID) | ORAL | Status: DC | PRN
  Administered 2022-01-19 – 2022-01-20 (×2): 4 mg via ORAL
  Filled 2022-01-18 (×2): qty 1

## 2022-01-18 MED ORDER — METHOCARBAMOL 500 MG IVPB - SIMPLE MED: 500.0000 mg | Freq: Four times a day (QID) | INTRAVENOUS | Status: DC | PRN

## 2022-01-18 MED ORDER — MIDAZOLAM HCL 5 MG/5ML IJ SOLN
INTRAMUSCULAR | Status: DC | PRN
  Administered 2022-01-18: 1 mg via INTRAVENOUS

## 2022-01-18 MED ORDER — ONDANSETRON HCL 4 MG/2ML IJ SOLN
4.0000 mg | Freq: Four times a day (QID) | INTRAMUSCULAR | Status: DC | PRN
  Administered 2022-01-18 – 2022-01-19 (×2): 4 mg via INTRAVENOUS
  Filled 2022-01-18 (×2): qty 2

## 2022-01-18 SURGICAL SUPPLY — 44 items
ADH SKN CLS APL DERMABOND .7 (GAUZE/BANDAGES/DRESSINGS) ×1
BAG COUNTER SPONGE SURGICOUNT (BAG) IMPLANT
BAG DECANTER FOR FLEXI CONT (MISCELLANEOUS) IMPLANT
BAG SPEC THK2 15X12 ZIP CLS (MISCELLANEOUS)
BAG SPNG CNTER NS LX DISP (BAG) ×1
BAG ZIPLOCK 12X15 (MISCELLANEOUS) IMPLANT
BLADE SAG 18X100X1.27 (BLADE) ×2 IMPLANT
COVER PERINEAL POST (MISCELLANEOUS) ×2 IMPLANT
COVER SURGICAL LIGHT HANDLE (MISCELLANEOUS) ×2 IMPLANT
CUP ACETBLR 52 OD PINNACLE (Hips) IMPLANT
DERMABOND ADVANCED (GAUZE/BANDAGES/DRESSINGS) ×1
DERMABOND ADVANCED .7 DNX12 (GAUZE/BANDAGES/DRESSINGS) ×2 IMPLANT
DRAPE FOOT SWITCH (DRAPES) ×2 IMPLANT
DRAPE STERI IOBAN 125X83 (DRAPES) ×2 IMPLANT
DRAPE U-SHAPE 47X51 STRL (DRAPES) ×4 IMPLANT
DRESSING AQUACEL AG SP 3.5X10 (GAUZE/BANDAGES/DRESSINGS) ×2 IMPLANT
DRSG AQUACEL AG SP 3.5X10 (GAUZE/BANDAGES/DRESSINGS) ×1
DURAPREP 26ML APPLICATOR (WOUND CARE) ×2 IMPLANT
ELECT REM PT RETURN 15FT ADLT (MISCELLANEOUS) ×2 IMPLANT
ELIMINATOR HOLE APEX DEPUY (Hips) IMPLANT
GLOVE BIO SURGEON STRL SZ 6 (GLOVE) ×2 IMPLANT
GLOVE BIOGEL PI IND STRL 6.5 (GLOVE) ×2 IMPLANT
GLOVE BIOGEL PI IND STRL 7.5 (GLOVE) ×2 IMPLANT
GLOVE BIOGEL PI INDICATOR 6.5 (GLOVE) ×1
GLOVE BIOGEL PI INDICATOR 7.5 (GLOVE) ×1
GLOVE ORTHO TXT STRL SZ7.5 (GLOVE) ×4 IMPLANT
GOWN STRL REUS W/ TWL LRG LVL3 (GOWN DISPOSABLE) ×6 IMPLANT
GOWN STRL REUS W/TWL LRG LVL3 (GOWN DISPOSABLE) ×3
HEAD CERAMIC DELTA 36 PLUS 1.5 (Hips) IMPLANT
HOLDER FOLEY CATH W/STRAP (MISCELLANEOUS) ×2 IMPLANT
KIT TURNOVER KIT A (KITS) IMPLANT
LINER NEUTRAL 52X36MM PLUS 4 (Liner) IMPLANT
PACK ANTERIOR HIP CUSTOM (KITS) ×2 IMPLANT
SCREW 6.5MMX25MM (Screw) IMPLANT
STEM FEMORAL SZ6 HIGH ACTIS (Stem) IMPLANT
SUT MNCRL AB 4-0 PS2 18 (SUTURE) ×2 IMPLANT
SUT STRATAFIX 0 PDS 27 VIOLET (SUTURE) ×1
SUT VIC AB 1 CT1 36 (SUTURE) ×6 IMPLANT
SUT VIC AB 2-0 CT1 27 (SUTURE) ×2
SUT VIC AB 2-0 CT1 TAPERPNT 27 (SUTURE) ×4 IMPLANT
SUTURE STRATFX 0 PDS 27 VIOLET (SUTURE) ×2 IMPLANT
TRAY FOLEY W/BAG SLVR 14FR LF (SET/KITS/TRAYS/PACK) IMPLANT
TUBE SUCTION HIGH CAP CLEAR NV (SUCTIONS) ×2 IMPLANT
WATER STERILE IRR 1000ML POUR (IV SOLUTION) ×2 IMPLANT

## 2022-01-18 NOTE — Discharge Instructions (Signed)

## 2022-01-18 NOTE — Interval H&P Note (Signed)
History and Physical Interval Note:  01/18/2022 7:03 AM  Laura Robles  has presented today for surgery, with the diagnosis of Right hip osteoarthritis.  The various methods of treatment have been discussed with the patient and family. After consideration of risks, benefits and other options for treatment, the patient has consented to  Procedure(s): TOTAL HIP ARTHROPLASTY ANTERIOR APPROACH (Right) as a surgical intervention.  The patient's history has been reviewed, patient examined, no change in status, stable for surgery.  I have reviewed the patient's chart and labs.  Questions were answered to the patient's satisfaction.     [name]

## 2022-01-18 NOTE — Anesthesia Postprocedure Evaluation (Signed)
Anesthesia Post Note  Patient: Laura Robles  Procedure(s) Performed: TOTAL HIP ARTHROPLASTY ANTERIOR APPROACH (Right: Hip)     Patient location during evaluation: PACU Anesthesia Type: Spinal Level of consciousness: awake and alert, patient cooperative and oriented Pain management: pain level controlled Vital Signs Assessment: post-procedure vital signs reviewed and stable Respiratory status: nonlabored ventilation, spontaneous breathing and respiratory function stable Cardiovascular status: blood pressure returned to baseline and stable Postop Assessment: patient able to bend at knees, no apparent nausea or vomiting and spinal receding Anesthetic complications: no   No notable events documented.  Last Vitals:  Vitals:   01/18/22 1215 01/18/22 1230  BP: (!) 142/74 138/75  Pulse: 71 76  Resp: 15 15  Temp:  36.7 C  SpO2: 100% 100%    Last Pain:  Vitals:   01/18/22 1230  TempSrc:   PainSc: 4     LLE Motor Response: Purposeful movement (01/18/22 1230) LLE Sensation: Decreased (01/18/22 1230) RLE Motor Response: Purposeful movement (01/18/22 1230) RLE Sensation: Decreased (01/18/22 1230) L Sensory Level: S1-Sole of foot, small toes (01/18/22 1230) R Sensory Level: S1-Sole of foot, small toes (01/18/22 1230)  Skip Litke,E. Corvette Orser

## 2022-01-18 NOTE — Op Note (Signed)
NAME:  Laura Robles                ACCOUNT NO.: [id number]      MEDICAL RECORD NO.: [id number]      FACILITY:  [facility]      PHYSICIAN:  [name]  DATE OF BIRTH:  July 04, 1945     DATE OF PROCEDURE:  01/18/2022                                 OPERATIVE REPORT         PREOPERATIVE DIAGNOSIS: Right  hip osteoarthritis.      POSTOPERATIVE DIAGNOSIS:  Right hip osteoarthritis.      PROCEDURE:  Right total hip replacement through an anterior approach   utilizing DePuy THR system, component size 52 mm pinnacle cup, a size 36+4 neutral   Altrex liner, a size 6 Hi Actis stem with a 36+1.5 delta ceramic   ball.      SURGEON:  Madlyn Frankel. Charlann Boxer, M.D.      ASSISTANT:  Rosalene Billings, PA-C     ANESTHESIA:  Spinal.      SPECIMENS:  None.      COMPLICATIONS:  None.      BLOOD LOSS:  200 cc     DRAINS:  None.      INDICATION OF THE PROCEDURE:  Laura Robles is a 76 y.o. female who had   presented to office for evaluation of right hip pain.  Radiographs revealed   progressive degenerative changes with bone-on-bone   articulation of the  hip joint, including subchondral cystic changes and osteophytes.  The patient had painful limited range of   motion significantly affecting their overall quality of life and function.  The patient was failing to    respond to conservative measures including medications and/or injections and activity modification and at this point was ready   to proceed with more definitive measures.  Consent was obtained for   benefit of pain relief.  Specific risks of infection, DVT, component   failure, dislocation, neurovascular injury, and need for revision surgery were reviewed in the office.     PROCEDURE IN DETAIL:  The patient was brought to operative theater.   Once adequate anesthesia, preoperative antibiotics, 2 gm of Ancef, 1 gm of Tranexamic Acid, and 10 mg of Decadron were administered, the patient was positioned supine on  the [employer] table.  Once the patient was safely positioned with adequate padding of boney prominences we predraped out the hip, and used fluoroscopy to confirm orientation of the pelvis.      The right hip was then prepped and draped from proximal iliac crest to   mid thigh with a shower curtain technique.      Time-out was performed identifying the patient, planned procedure, and the appropriate extremity.     An incision was then made 2 cm lateral to the   anterior superior iliac spine extending over the orientation of the   tensor fascia lata muscle and sharp dissection was carried down to the   fascia of the muscle.      The fascia was then incised.  The muscle belly was identified and swept   laterally and retractor placed along the superior neck.  Following   cauterization of the circumflex vessels and removing some pericapsular   fat, a second cobra retractor was placed on the inferior  neck.  A T-capsulotomy was made along the line of the   superior neck to the trochanteric fossa, then extended proximally and   distally.  Tag sutures were placed and the retractors were then placed   intracapsular.  We then identified the trochanteric fossa and   orientation of my neck cut and then made a neck osteotomy with the femur on traction.  The femoral   head was removed without difficulty or complication.  Traction was let   off and retractors were placed posterior and anterior around the   acetabulum.      The labrum and foveal tissue were debrided.  I began reaming with a 45 mm   reamer and reamed up to 51 mm reamer with good bony bed preparation and a 52 mm  cup was chosen.  The final 52 mm Pinnacle cup was then impacted under fluoroscopy to confirm the depth of penetration and orientation with respect to   Abduction and forward flexion.  A screw was placed into the ilium followed by the hole eliminator.  The final   36+4 neutral Altrex liner was impacted with good visualized rim fit.   The cup was positioned anatomically within the acetabular portion of the pelvis.      At this point, the femur was rolled to 100 degrees.  Further capsule was   released off the inferior aspect of the femoral neck.  I then   released the superior capsule proximally.  With the leg in a neutral position the hook was placed laterally   along the femur under the vastus lateralis origin and elevated manually and then held in position using the hook attachment on the bed.  The leg was then extended and adducted with the leg rolled to 100   degrees of external rotation.  Retractors were placed along the medial calcar and posteriorly over the greater trochanter.  Once the proximal femur was fully   exposed, I used a box osteotome to set orientation.  I then began   broaching with the starting chili pepper broach and passed this by hand and then broached up to 6.  With the 6 broach in place I chose a high offset neck and did several trial reductions.  The offset was appropriate, leg lengths   appeared to be equal best matched with the +1.5 head ball trial confirmed radiographically.   Given these findings, I went ahead and dislocated the hip, repositioned all   retractors and positioned the right hip in the extended and abducted position.  The final 6 Hi Actis stem was   chosen and it was impacted down to the level of neck cut.  Based on this   and the trial reductions, a final 36+1.5 delta ceramic ball was chosen and   impacted onto a clean and dry trunnion, and the hip was reduced.  The   hip had been irrigated throughout the case again at this point.  I did   reapproximate the superior capsular leaflet to the anterior leaflet   using #1 Vicryl.  The fascia of the   tensor fascia lata muscle was then reapproximated using #1 Vicryl and #0 Stratafix sutures.  The   remaining wound was closed with 2-0 Vicryl and running 4-0 Monocryl.   The hip was cleaned, dried, and dressed sterilely using Dermabond and    Aquacel dressing.  The patient was then brought   to recovery room in stable condition tolerating the procedure well.    Morrie Sheldon  Domenic Schwab, PA-C was present for the entirety of the case involved from   preoperative positioning, perioperative retractor management, general   facilitation of the case, as well as primary wound closure as assistant.            Madlyn Frankel Charlann Boxer, M.D.        01/18/2022 8:35 AM

## 2022-01-18 NOTE — Evaluation (Signed)
Physical Therapy Evaluation Patient Details Name: Laura Robles MRN: [id number] DOB: 10/23/1945 Today's Date: 01/18/2022  History of Present Illness  76 yo female s/P R THA, direct anterior on 01/18/22. PMH: OSA,HTN,IBS,sickle cell anemia,.  Clinical Impression   The patient reporting pain 10/10 right hip/thigh but willing to mobilize. Patient ambulated x 8 ', requested to sit down due to increased pain. Patient was premedicated.  Patient should progress with PT  to return home with family assisting.        Recommendations for follow up therapy are one component of a multi-disciplinary discharge planning process, led by the attending physician.  Recommendations may be updated based on patient status, additional functional criteria and insurance authorization.  Follow Up Recommendations Follow physician's recommendations for discharge plan and follow up therapies      Assistance Recommended at Discharge Set up Supervision/Assistance  Patient can return home with the following  Help with stairs or ramp for entrance;A little help with bathing/dressing/bathroom;Assistance with cooking/housework;Assist for transportation    Equipment Recommendations Rolling walker (2 wheels)  Recommendations for Other Services       Functional Status Assessment Patient has had a recent decline in their functional status and demonstrates the ability to make significant improvements in function in a reasonable and predictable amount of time.     Precautions / Restrictions Precautions Precautions: Fall Restrictions Weight Bearing Restrictions: No      Mobility  Bed Mobility Overal bed mobility: Needs Assistance Bed Mobility: Supine to Sit     Supine to sit: HOB elevated, Min assist     General bed mobility comments: used belt to slide right leg to bed edge    Transfers Overall transfer level: Needs assistance Equipment used: Rolling walker (2 wheels) Transfers: Sit to/from Stand Sit to  Stand: Min assist           General transfer comment: cues for hand and right leg position    Ambulation/Gait Ambulation/Gait assistance: Min assist Gait Distance (Feet): 8 Feet Assistive device: Rolling walker (2 wheels) Gait Pattern/deviations: Step-to pattern, Decreased stance time - right, Decreased step length - right, Antalgic Gait velocity: decr     General Gait Details: Patient reported increased pain , recliner brou ght up.  Stairs            Wheelchair Mobility    Modified Rankin (Stroke Patients Only)       Balance Overall balance assessment: Needs assistance Sitting-balance support: Feet supported Sitting balance-Leahy Scale: Good     Standing balance support: During functional activity, Bilateral upper extremity supported, Reliant on assistive device for balance Standing balance-Leahy Scale: Fair                               Pertinent Vitals/Pain Pain Assessment Pain Assessment: 0-10 Pain Score: 10-Worst pain ever Pain Location: right hip Pain Descriptors / Indicators: Aching, Grimacing, Guarding, Discomfort Pain Intervention(s): Limited activity within patient's tolerance, Monitored during session, Premedicated before session, Repositioned    Home Living Family/patient expects to be discharged to:: Private residence Living Arrangements: Alone Available Help at Discharge: Friend(s);Available 24 hours/day Type of Home: House Home Access: Stairs to enter Entrance Stairs-Rails: Doctor, general practice of Steps: 3   Home Layout: One level Home Equipment: None Additional Comments: will be staying with sister /friend.    Prior Function Prior Level of Function : Independent/Modified Independent  Hand Dominance        Extremity/Trunk Assessment   Upper Extremity Assessment Upper Extremity Assessment: Overall WFL for tasks assessed    Lower Extremity Assessment Lower Extremity  Assessment: RLE deficits/detail RLE Deficits / Details: able to flex to 60, WBAT       Communication      Cognition Arousal/Alertness: Awake/alert Behavior During Therapy: WFL for tasks assessed/performed Overall Cognitive Status: Within Functional Limits for tasks assessed                                          General Comments      Exercises Total Joint Exercises Heel Slides: AAROM, Right, 5 reps   Assessment/Plan    PT Assessment Patient needs continued PT services  PT Problem List Decreased strength;Decreased mobility;Decreased range of motion;Decreased activity tolerance;Pain;Decreased balance       PT Treatment Interventions DME instruction;Therapeutic activities;Gait training;Therapeutic exercise;Patient/family education;Stair training;Functional mobility training    PT Goals (Current goals can be found in the Care Plan section)  Acute Rehab PT Goals Patient Stated Goal: to go home PT Goal Formulation: With patient/family Time For Goal Achievement: 01/25/22 Potential to Achieve Goals: Good    Frequency 7X/week     Co-evaluation               AM-PAC PT "6 Clicks" Mobility  Outcome Measure Help needed turning from your back to your side while in a flat bed without using bedrails?: A Little Help needed moving from lying on your back to sitting on the side of a flat bed without using bedrails?: A Little Help needed moving to and from a bed to a chair (including a wheelchair)?: A Little Help needed standing up from a chair using your arms (e.g., wheelchair or bedside chair)?: A Little Help needed to walk in hospital room?: A Little Help needed climbing 3-5 steps with a railing? : A Lot 6 Click Score: 17    End of Session Equipment Utilized During Treatment: Gait belt Activity Tolerance: Patient limited by pain Patient left: in chair;with call bell/phone within reach;with chair alarm set Nurse Communication: Mobility status PT Visit  Diagnosis: Unsteadiness on feet (R26.81);Difficulty in walking, not elsewhere classified (R26.2)    Time: 8416-6063 PT Time Calculation (min) (ACUTE ONLY): 25 min   Charges:   PT Evaluation $PT Eval Low Complexity: 1 Low PT Treatments $Gait Training: 8-22 mins        Blanchard Kelch PT Acute Rehabilitation Services Office [phone] Weekend [phone]   Rada Hay 01/18/2022, 4:09 PM

## 2022-01-18 NOTE — Transfer of Care (Signed)
Immediate Anesthesia Transfer of Care Note  Patient: Laura Robles  Procedure(s) Performed: Procedure(s): TOTAL HIP ARTHROPLASTY ANTERIOR APPROACH (Right)  Patient Location: PACU  Anesthesia Type:Spinal  Level of Consciousness:  sedated, patient cooperative and responds to stimulation  Airway & Oxygen Therapy:Patient Spontanous Breathing and Patient connected to face mask oxgen  Post-op Assessment:  Report given to PACU RN and Post -op Vital signs reviewed and stable  Post vital signs:  Reviewed and stable  Last Vitals:  Vitals:   01/18/22 0647 01/18/22 1000  BP: (!) 157/88 (!) 110/59  Pulse: 93 63  Resp: 16 14  Temp: 36.9 C (!) 36.3 C  SpO2: 97% 100%    Complications: No apparent anesthesia complications

## 2022-01-18 NOTE — Anesthesia Procedure Notes (Signed)
Spinal  Patient location during procedure: OR End time: 01/18/2022 8:28 AM Reason for block: surgical anesthesia Staffing Performed: anesthesiologist  Anesthesiologist: Jairo Ben, MD Performed by: Jairo Ben, MD Authorized by: Jairo Ben, MD   Preanesthetic Checklist Completed: patient identified, IV checked, site marked, risks and benefits discussed, surgical consent, monitors and equipment checked, pre-op evaluation and timeout performed Spinal Block Patient position: sitting Prep: DuraPrep and site prepped and draped Patient monitoring: blood pressure, continuous pulse ox, cardiac monitor and heart rate Approach: midline Location: L3-4 Injection technique: single-shot Needle Needle type: Pencan and Introducer  Needle length: 9 cm Assessment Events: CSF return Additional Notes Pt identified in Operating room.  Monitors applied. Working IV access confirmed. Sterile prep, drape lumbar spine.  1% lido local L 3,4.  #24ga Pencan into clear CSF L 3,4.  12mg  0.75% Bupivacaine with dextrose injected with asp CSF beginning and end of injection.  Patient asymptomatic, VSS, no heme aspirated, tolerated well.  , MD

## 2022-01-18 NOTE — Plan of Care (Signed)
Plan of care reviewed and discussed with the patient. 

## 2022-01-19 ENCOUNTER — Encounter (HOSPITAL_COMMUNITY): Payer: Self-pay | Admitting: Orthopedic Surgery

## 2022-01-19 DIAGNOSIS — M1611 Unilateral primary osteoarthritis, right hip: Secondary | ICD-10-CM | POA: Diagnosis not present

## 2022-01-19 LAB — CBC
HCT: 34.5 % — ABNORMAL LOW (ref 36.0–46.0)
Hemoglobin: 11.4 g/dL — ABNORMAL LOW (ref 12.0–15.0)
MCH: 27.2 pg (ref 26.0–34.0)
MCHC: 33 g/dL (ref 30.0–36.0)
MCV: 82.3 fL (ref 80.0–100.0)
Platelets: 242 10*3/uL (ref 150–400)
RBC: 4.19 MIL/uL (ref 3.87–5.11)
RDW: 13.2 % (ref 11.5–15.5)
WBC: 9.9 10*3/uL (ref 4.0–10.5)
nRBC: 0 % (ref 0.0–0.2)

## 2022-01-19 LAB — BASIC METABOLIC PANEL
Anion gap: 7 (ref 5–15)
BUN: 11 mg/dL (ref 8–23)
CO2: 23 mmol/L (ref 22–32)
Calcium: 8.2 mg/dL — ABNORMAL LOW (ref 8.9–10.3)
Chloride: 108 mmol/L (ref 98–111)
Creatinine, Ser: 0.74 mg/dL (ref 0.44–1.00)
GFR, Estimated: >60 mL/min (ref >60)
Glucose, Bld: 121 mg/dL — ABNORMAL HIGH (ref 70–99)
Potassium: 3.8 mmol/L (ref 3.5–5.1)
Sodium: 138 mmol/L (ref 135–145)

## 2022-01-19 MED ORDER — HYDROCODONE-ACETAMINOPHEN 5-325 MG PO TABS: 1.0000 | ORAL_TABLET | ORAL | 0 refills | Status: DC | PRN

## 2022-01-19 MED ORDER — METHOCARBAMOL 500 MG PO TABS: 500.0000 mg | ORAL_TABLET | Freq: Four times a day (QID) | ORAL | 0 refills | Status: DC | PRN

## 2022-01-19 MED ORDER — ASPIRIN 81 MG PO CHEW: 81.0000 mg | CHEWABLE_TABLET | Freq: Two times a day (BID) | ORAL | 0 refills | Status: AC

## 2022-01-19 MED ORDER — DOCUSATE SODIUM 100 MG PO CAPS: 100.0000 mg | ORAL_CAPSULE | Freq: Two times a day (BID) | ORAL | 0 refills | Status: DC

## 2022-01-19 MED ORDER — POLYETHYLENE GLYCOL 3350 17 G PO PACK: 17.0000 g | PACK | Freq: Every day | ORAL | 0 refills | Status: DC | PRN

## 2022-01-19 NOTE — Progress Notes (Signed)
Physical Therapy Treatment Patient Details Name: Laura Robles MRN: [id number] DOB: 29-Dec-1945 Today's Date: 01/19/2022   History of Present Illness 76 yo female s/P R THA, direct anterior on 01/18/22. PMH: OSA,HTN,IBS,sickle cell anemia,.    PT Comments    Patient demonstrates improved  mobility, less assistance for RLE and reports right hip pain less. Patient reports has issues with medications and IBS, working  through this, trying to eat.  Patient should continue to progress to Dc tomorrow hopefully.  Patient  demonstrated improved  erect posture and able to advance the RLE with more conrol.   Recommendations for follow up therapy are one component of a multi-disciplinary discharge planning process, led by the attending physician.  Recommendations may be updated based on patient status, additional functional criteria and insurance authorization.  Follow Up Recommendations  Follow physician's recommendations for discharge plan and follow up therapies     Assistance Recommended at Discharge Set up Supervision/Assistance  Patient can return home with the following Help with stairs or ramp for entrance;A little help with bathing/dressing/bathroom;Assistance with cooking/housework;Assist for transportation;A little help with walking and/or transfers   Equipment Recommendations  Rolling walker (2 wheels)    Recommendations for Other Services       Precautions / Restrictions Precautions Precautions: Fall Restrictions Weight Bearing Restrictions: No     Mobility  Bed Mobility   Bed Mobility: Supine to Sit     Supine to sit: HOB elevated, Min assist     General bed mobility comments: in recliner    Transfers Overall transfer level: Needs assistance Equipment used: Rolling walker (2 wheels) Transfers: Sit to/from Stand Sit to Stand: Min guard           General transfer comment: from recliner and BSC with arms    Ambulation/Gait Ambulation/Gait assistance:  Mod assist Gait Distance (Feet): 15 Feet (x2) Assistive device: Rolling walker (2 wheels) Gait Pattern/deviations: Step-to pattern, Decreased stance time - right, Decreased step length - right, Antalgic, Trunk flexed Gait velocity: decr     General Gait Details: posture more upright, advancing RLE with more conrol.   Stairs             Wheelchair Mobility    Modified Rankin (Stroke Patients Only)       Balance   Sitting-balance support: Feet supported Sitting balance-Leahy Scale: Good     Standing balance support: During functional activity, Reliant on assistive device for balance, Single extremity supported Standing balance-Leahy Scale: Fair                              Cognition Arousal/Alertness: Awake/alert Behavior During Therapy: Anxious                                   General Comments: seems less stressed about pain and abdomen upset from medications        Exercises  Instructed in HS and ABDuction of R LE using belt.    General Comments        Pertinent Vitals/Pain Pain Assessment Pain Score: 6  Pain Location: right hip Pain Descriptors / Indicators: Aching, Grimacing, Guarding, Discomfort Pain Intervention(s): Premedicated before session, Monitored during session, Ice applied, Repositioned    Home Living                          Prior  Function            PT Goals (current goals can now be found in the care plan section) Progress towards PT goals: Progressing toward goals    Frequency    7X/week      PT Plan Current plan remains appropriate    Co-evaluation              AM-PAC PT "6 Clicks" Mobility   Outcome Measure  Help needed turning from your back to your side while in a flat bed without using bedrails?: A Little Help needed moving from lying on your back to sitting on the side of a flat bed without using bedrails?: A Little Help needed moving to and from a bed to a chair  (including a wheelchair)?: A Little Help needed standing up from a chair using your arms (e.g., wheelchair or bedside chair)?: A Little Help needed to walk in hospital room?: A Little Help needed climbing 3-5 steps with a railing? : A Lot 6 Click Score: 17    End of Session Equipment Utilized During Treatment: Gait belt Activity Tolerance: Patient tolerated treatment well Patient left: in chair;with call bell/phone within reach Nurse Communication: Mobility status PT Visit Diagnosis: Unsteadiness on feet (R26.81);Difficulty in walking, not elsewhere classified (R26.2)     Time: 1530-1550 PT Time Calculation (min) (ACUTE ONLY): 20 min  Charges:  $Gait Training: 8-22 mins                     Blanchard Kelch PT Acute Rehabilitation Services Office [phone] Weekend [phone]    Rada Hay 01/19/2022, 3:55 PM

## 2022-01-19 NOTE — TOC Transition Note (Signed)
Transition of Care [facility]) - CM/SW Discharge Note  Patient Details  Name: Laura Robles MRN: [id number] Date of Birth: [birth year]  [facility] CM/SW Contact:  Sherie Don, LCSW Phone Number: 01/19/2022, 11:31 AM  Clinical Narrative: Patient is expected to discharge home after working with PT. CSW met with patient to confirm discharge plan and needs. Patient will go home with a home exercise program (HEP). Patient will need a rolling walker, which was delivered to patient's room by [employer]. TOC signing off.    Final next level of care: Home/Self Care Barriers to Discharge: No Barriers Identified  Patient Goals and CMS Choice Patient states their goals for this hospitalization and ongoing recovery are:: Discharge home with HEP CMS Medicare.gov Compare Post Acute Care list provided to:: Patient Choice offered to / list presented to : Patient  Discharge Plan and Services        DME Arranged: Walker rolling DME Agency: [employer] Representative spoke with at DME Agency: Prearranged in orthopedist's office  Readmission Risk Interventions     No data to display

## 2022-01-19 NOTE — Progress Notes (Signed)
Physical Therapy Treatment Patient Details Name: Laura Robles MRN: [id number] DOB: 06-19-1945 Today's Date: 01/19/2022   History of Present Illness 76 yo female s/P R THA, direct anterior on 01/18/22. PMH: OSA,HTN,IBS,sickle cell anemia,.    PT Comments    Patient mobility very limited, requires extra time to move RLE to sit up on bed edge, using belt to self assist with RLE..  Gait limited, flexed trunk, decreased  step length on Right, stepping to sequence , leading with LLE. Amb. X 15' x 2, requiring assistnace and much extra time.  Continue PT in PM, most likely will not meet goals for Dc today, also has 3 STE and caregiver  will be supervision only. Patient reports nausea(premedicated) and dizziness. BP 158/76.  Rates pain 8/10-premedicated.   Recommendations for follow up therapy are one component of a multi-disciplinary discharge planning process, led by the attending physician.  Recommendations may be updated based on patient status, additional functional criteria and insurance authorization.  Follow Up Recommendations  Follow physician's recommendations for discharge plan and follow up therapies     Assistance Recommended at Discharge Set up Supervision/Assistance  Patient can return home with the following Help with stairs or ramp for entrance;A little help with bathing/dressing/bathroom;Assistance with cooking/housework;Assist for transportation;A little help with walking and/or transfers   Equipment Recommendations  Rolling walker (2 wheels)    Recommendations for Other Services       Precautions / Restrictions Precautions Precautions: Fall     Mobility  Bed Mobility   Bed Mobility: Supine to Sit     Supine to sit: HOB elevated, Min assist     General bed mobility comments: used belt to slide right leg to bed edge, much extra time and  bed rail used    Transfers Overall transfer level: Needs assistance Equipment used: Rolling walker (2  wheels) Transfers: Sit to/from Stand Sit to Stand: Min assist           General transfer comment: cues for hand and right leg position, slow to rise from bed and BSC, use of rail and armrests    Ambulation/Gait Ambulation/Gait assistance: Mod assist Gait Distance (Feet): 15 Feet (x 2) Assistive device: Rolling walker (2 wheels) Gait Pattern/deviations: Step-to pattern, Decreased stance time - right, Decreased step length - right, Antalgic, Trunk flexed Gait velocity: decr     General Gait Details: patient with  flexed posture , decreased WB on the RLE with knee flexed, WB on forefoot.   Stairs             Wheelchair Mobility    Modified Rankin (Stroke Patients Only)       Balance   Sitting-balance support: Feet supported Sitting balance-Leahy Scale: Good     Standing balance support: During functional activity, Bilateral upper extremity supported, Reliant on assistive device for balance Standing balance-Leahy Scale: Poor                              Cognition Arousal/Alertness: Awake/alert Behavior During Therapy: Anxious                                            Exercises      General Comments        Pertinent Vitals/Pain Pain Assessment Pain Score: 8  Pain Location: right hip Pain Descriptors / Indicators:  Aching, Grimacing, Guarding, Discomfort Pain Intervention(s): Limited activity within patient's tolerance, Monitored during session, Premedicated before session, Ice applied, Repositioned    Home Living                          Prior Function            PT Goals (current goals can now be found in the care plan section) Progress towards PT goals: Progressing toward goals    Frequency    7X/week      PT Plan Current plan remains appropriate    Co-evaluation              AM-PAC PT "6 Clicks" Mobility   Outcome Measure  Help needed turning from your back to your side while in a  flat bed without using bedrails?: A Little Help needed moving from lying on your back to sitting on the side of a flat bed without using bedrails?: A Little Help needed moving to and from a bed to a chair (including a wheelchair)?: A Little Help needed standing up from a chair using your arms (e.g., wheelchair or bedside chair)?: A Little Help needed to walk in hospital room?: A Lot Help needed climbing 3-5 steps with a railing? : A Lot 6 Click Score: 16    End of Session Equipment Utilized During Treatment: Gait belt Activity Tolerance: Patient limited by pain Patient left: in chair;with call bell/phone within reach;with nursing/sitter in room Nurse Communication: Mobility status PT Visit Diagnosis: Unsteadiness on feet (R26.81);Difficulty in walking, not elsewhere classified (R26.2)     Time: 1015-1050 PT Time Calculation (min) (ACUTE ONLY): 35 min  Charges:  $Gait Training: 8-22 mins $Self Care/Home Management: 8-22                     Blanchard Kelch PT Acute Rehabilitation Services Office [phone] Weekend pager-[phone]    Rada Hay 01/19/2022, 11:06 AM

## 2022-01-19 NOTE — Progress Notes (Signed)
   Subjective: 1 Day Post-Op Procedure(s) (LRB): TOTAL HIP ARTHROPLASTY ANTERIOR APPROACH (Right) Patient reports pain as mild.   Patient seen in rounds with Dr. Charlann Boxer. Patient is resting in bed with family at the bedside this morning. No acute events overnight. Ambulated a few feet with PT yesterday. We will continue therapy today.   Objective: Vital signs in last 24 hours: Temp:  [97.4 F (36.3 C)-98.3 F (36.8 C)] 98.3 F (36.8 C) (08/23 0547) Pulse Rate:  [54-78] 78 (08/23 0547) Resp:  [12-18] 15 (08/23 0547) BP: (110-156)/(59-93) 116/59 (08/23 0547) SpO2:  [95 %-100 %] 97 % (08/23 0547)  Intake/Output from previous day:  Intake/Output Summary (Last 24 hours) at 01/19/2022 0753 Last data filed at 01/19/2022 0600 Gross per 24 hour  Intake 3024.29 ml  Output 2300 ml  Net 724.29 ml     Intake/Output this shift: No intake/output data recorded.  Labs: Recent Labs    01/19/22 0323  HGB 11.4*   Recent Labs    01/19/22 0323  WBC 9.9  RBC 4.19  HCT 34.5*  PLT 242   Recent Labs    01/19/22 0323  NA 138  K 3.8  CL 108  CO2 23  BUN 11  CREATININE 0.74  GLUCOSE 121*  CALCIUM 8.2*   No results for input(s): "LABPT", "INR" in the last 72 hours.  Exam: General - Patient is Alert and Oriented Extremity - Neurologically intact Sensation intact distally Intact pulses distally Dorsiflexion/Plantar flexion intact Dressing - dressing C/D/I Motor Function - intact, moving foot and toes well on exam.   Past Medical History:  Diagnosis Date   Anemia    in past   Arthritis    Cataracts, bilateral    Diarrhea    chronic   Ectopic pregnancy    GERD (gastroesophageal reflux disease)    Hypertension    IBS (irritable bowel syndrome)    Lactose intolerance    OSA on CPAP 2018   Osteoporosis    Plantar fasciitis    Sickle cell anemia (HCC)    has a trait   Sleep apnea    uses c-pap   Vitamin D deficiency     Assessment/Plan: 1 Day Post-Op Procedure(s)  (LRB): TOTAL HIP ARTHROPLASTY ANTERIOR APPROACH (Right) Principal Problem:   S/P total right hip arthroplasty  Estimated body mass index is 33.84 kg/m as calculated from the following:   Height as of this encounter: 5\' 2"  (1.575 m).   Weight as of this encounter: 83.9 kg. Advance diet Up with therapy D/C IV fluids  DVT Prophylaxis - Aspirin Weight bearing as tolerated.  Hgb stable at 11.4 this AM.  Plan is to go Home after hospital stay. Plan for discharge today after meeting goals with therapy. Follow up in the office in 2 weeks.   , PA-C Orthopedic Surgery [phone] 01/19/2022, 7:53 AM

## 2022-01-20 DIAGNOSIS — M1611 Unilateral primary osteoarthritis, right hip: Secondary | ICD-10-CM | POA: Diagnosis not present

## 2022-01-20 LAB — CBC
HCT: 33.4 % — ABNORMAL LOW (ref 36.0–46.0)
Hemoglobin: 11.1 g/dL — ABNORMAL LOW (ref 12.0–15.0)
MCH: 27.5 pg (ref 26.0–34.0)
MCHC: 33.2 g/dL (ref 30.0–36.0)
MCV: 82.7 fL (ref 80.0–100.0)
Platelets: 238 10*3/uL (ref 150–400)
RBC: 4.04 MIL/uL (ref 3.87–5.11)
RDW: 13.3 % (ref 11.5–15.5)
WBC: 10.6 10*3/uL — ABNORMAL HIGH (ref 4.0–10.5)
nRBC: 0 % (ref 0.0–0.2)

## 2022-01-20 MED ORDER — ONDANSETRON HCL 4 MG PO TABS: 4.0000 mg | ORAL_TABLET | Freq: Four times a day (QID) | ORAL | 0 refills | Status: DC | PRN

## 2022-01-20 NOTE — Plan of Care (Signed)
  Problem: Pain Management: Goal: Pain level will decrease with appropriate interventions Outcome: Progressing   Problem: Coping: Goal: Level of anxiety will decrease Outcome: Progressing   

## 2022-01-20 NOTE — Progress Notes (Signed)
Physical Therapy Treatment Patient Details Name: Laura Robles MRN: [id number] DOB: 21-Sep-1945 Today's Date: 01/20/2022   History of Present Illness 76 yo female s/P R THA, direct anterior on 01/18/22. PMH: OSA,HTN,IBS,sickle cell anemia,.    PT Comments    Patient making progress. Has achieved PT goals for Dc. Patient friend present and reviewed steps verbally.   Recommendations for follow up therapy are one component of a multi-disciplinary discharge planning process, led by the attending physician.  Recommendations may be updated based on patient status, additional functional criteria and insurance authorization.  Follow Up Recommendations  Follow physician's recommendations for discharge plan and follow up therapies     Assistance Recommended at Discharge Set up Supervision/Assistance  Patient can return home with the following Help with stairs or ramp for entrance;A little help with bathing/dressing/bathroom;Assistance with cooking/housework;Assist for transportation;A little help with walking and/or transfers   Equipment Recommendations  Rolling walker (2 wheels)    Recommendations for Other Services       Precautions / Restrictions Precautions Precautions: Fall Restrictions RLE Weight Bearing: Weight bearing as tolerated     Mobility  Bed Mobility   Bed Mobility: Supine to Sit, Sit to Supine     Supine to sit: Min assist Sit to supine: Min assist   General bed mobility comments: in recliner    Transfers Overall transfer level: Needs assistance Equipment used: Rolling walker (2 wheels) Transfers: Sit to/from Stand Sit to Stand: Supervision           General transfer comment: from recliner and BSC with arms    Ambulation/Gait Ambulation/Gait assistance: Supervision Gait Distance (Feet): 20 Feet (x 2) Assistive device: Rolling walker (2 wheels) Gait Pattern/deviations: Step-to pattern, Step-through pattern, Trunk flexed       General Gait  Details: cues for posture   Stairs Stairs: Yes Stairs assistance: Min assist, Min guard Stair Management: Two rails, Step to pattern, Forwards Number of Stairs: 3 General stair comments: 3 forwards with 2 rails, min assist with no rails, backward   Wheelchair Mobility    Modified Rankin (Stroke Patients Only)       Balance Overall balance assessment: Needs assistance Sitting-balance support: Feet supported Sitting balance-Leahy Scale: Good     Standing balance support: During functional activity, Reliant on assistive device for balance, Single extremity supported                                Cognition Arousal/Alertness: Awake/alert                                              Exercises Total Joint Exercises Ankle Circles/Pumps: AROM, Both, 10 reps Quad Sets: AROM, Both, 10 reps Short Arc Quad: AROM, Right, 10 reps Heel Slides: AAROM, Right, 10 reps Hip ABduction/ADduction: AAROM, Right, 10 reps Long Arc Quad: AROM, Right, 10 reps    General Comments        Pertinent Vitals/Pain Pain Assessment Pain Score: 5  Pain Location: right hip Pain Descriptors / Indicators: Aching, Grimacing, Guarding, Discomfort Pain Intervention(s): RN gave pain meds during session    Home Living                          Prior Function  PT Goals (current goals can now be found in the care plan section) Progress towards PT goals: Progressing toward goals    Frequency    7X/week      PT Plan Current plan remains appropriate    Co-evaluation              AM-PAC PT "6 Clicks" Mobility   Outcome Measure  Help needed turning from your back to your side while in a flat bed without using bedrails?: A Little Help needed moving from lying on your back to sitting on the side of a flat bed without using bedrails?: A Little Help needed moving to and from a bed to a chair (including a wheelchair)?: A Little Help  needed standing up from a chair using your arms (e.g., wheelchair or bedside chair)?: A Little Help needed to walk in hospital room?: A Little Help needed climbing 3-5 steps with a railing? : A Little 6 Click Score: 18    End of Session Equipment Utilized During Treatment: Gait belt Activity Tolerance: Patient tolerated treatment well Patient left: in chair;with call bell/phone within reach;with family/visitor present Nurse Communication: Mobility status PT Visit Diagnosis: Unsteadiness on feet (R26.81);Difficulty in walking, not elsewhere classified (R26.2)     Time: 5631-4970 PT Time Calculation (min) (ACUTE ONLY): 30 min  Charges:  $Gait Training: 8-22 mins  $Self Care/Home Management: 8-22                     {   Rada Hay 01/20/2022, 3:11 PM

## 2022-01-20 NOTE — Progress Notes (Signed)
Patient discharged to home w/ family. Given all belongings, instructions, equipment. Verbalized understanding of instructions.  

## 2022-01-20 NOTE — Progress Notes (Signed)
Physical Therapy Treatment Patient Details Name: Laura Robles MRN: [id number] DOB: [birth year] Today's Date: 01/20/2022   History of Present Illness 76 yo female s/P R THA, direct anterior on 01/18/22. PMH: OSA,HTN,IBS,sickle cell anemia,.    PT Comments    Patient reports feeling better.   Patient ambulated x 50', practiced steps forward and backward and instructed in HEP. Patient plans Dc today after PT second visit.   Recommendations for follow up therapy are one component of a multi-disciplinary discharge planning process, led by the attending physician.  Recommendations may be updated based on patient status, additional functional criteria and insurance authorization.  Follow Up Recommendations  Follow physician's recommendations for discharge plan and follow up therapies     Assistance Recommended at Discharge Set up Supervision/Assistance  Patient can return home with the following Help with stairs or ramp for entrance;A little help with bathing/dressing/bathroom;Assistance with cooking/housework;Assist for transportation;A little help with walking and/or transfers   Equipment Recommendations  Rolling walker (2 wheels)    Recommendations for Other Services       Precautions / Restrictions Precautions Precautions: Fall Restrictions RLE Weight Bearing: Weight bearing as tolerated     Mobility  Bed Mobility   Bed Mobility: Supine to Sit, Sit to Supine     Supine to sit: Min assist Sit to supine: Min assist   General bed mobility comments: use of belt, extra time to finally et right leg onto bed    Transfers Overall transfer level: Needs assistance Equipment used: Rolling walker (2 wheels) Transfers: Sit to/from Stand Sit to Stand: Supervision                Ambulation/Gait Ambulation/Gait assistance: Min guard Gait Distance (Feet): 15 Feet (then 50) Assistive device: Rolling walker (2 wheels) Gait Pattern/deviations: Step-to pattern, Step-through  pattern, Trunk flexed       General Gait Details: cues for posture   Stairs Stairs: Yes Stairs assistance: Min assist, Min guard Stair Management: Two rails, Step to pattern, Forwards Number of Stairs: 3 General stair comments: 3 forwards with 2 rails, min assist with no rails, backward   Wheelchair Mobility    Modified Rankin (Stroke Patients Only)       Balance Overall balance assessment: Needs assistance Sitting-balance support: Feet supported Sitting balance-Leahy Scale: Good     Standing balance support: During functional activity, Reliant on assistive device for balance, Single extremity supported                                Cognition Arousal/Alertness: Awake/alert                                              Exercises Total Joint Exercises Ankle Circles/Pumps: AROM, Both, 10 reps Quad Sets: AROM, Both, 10 reps Short Arc Quad: AROM, Right, 10 reps Heel Slides: AAROM, Right, 10 reps Hip ABduction/ADduction: AAROM, Right, 10 reps Long Arc Quad: AROM, Right, 10 reps    General Comments        Pertinent Vitals/Pain Pain Assessment Pain Score: 5  Pain Location: right hip Pain Descriptors / Indicators: Aching, Grimacing, Guarding, Discomfort Pain Intervention(s): Premedicated before session, Ice applied    Home Living  Prior Function            PT Goals (current goals can now be found in the care plan section) Progress towards PT goals: Progressing toward goals    Frequency    7X/week      PT Plan Current plan remains appropriate    Co-evaluation              AM-PAC PT "6 Clicks" Mobility   Outcome Measure  Help needed turning from your back to your side while in a flat bed without using bedrails?: A Little Help needed moving from lying on your back to sitting on the side of a flat bed without using bedrails?: A Little Help needed moving to and from a bed to a  chair (including a wheelchair)?: A Little Help needed standing up from a chair using your arms (e.g., wheelchair or bedside chair)?: A Little Help needed to walk in hospital room?: A Little Help needed climbing 3-5 steps with a railing? : A Little 6 Click Score: 18    End of Session Equipment Utilized During Treatment: Gait belt Activity Tolerance: Patient tolerated treatment well Patient left: in chair;with call bell/phone within reach Nurse Communication: Mobility status PT Visit Diagnosis: Unsteadiness on feet (R26.81);Difficulty in walking, not elsewhere classified (R26.2)     Time: 1950-9326 PT Time Calculation (min) (ACUTE ONLY): 65 min  Charges:  $Gait Training: 8-22 mins $Therapeutic Exercise: 8-22 mins $Therapeutic Activity: 8-22 mins $Self Care/Home Management: 8-22                     Blanchard Kelch PT Acute Rehabilitation Services Office [phone] Weekend pager-[phone]    Rada Hay 01/20/2022, 2:14 PM

## 2022-01-20 NOTE — Progress Notes (Signed)
   Subjective: 2 Days Post-Op Procedure(s) (LRB): TOTAL HIP ARTHROPLASTY ANTERIOR APPROACH (Right) Patient reports pain as mild.   Patient seen in rounds for Dr. Charlann Boxer. Patient is resting in bed on exam this morning. She is in good spirits. We called her son Rocky Link, so that he could ask questions. No acute events overnight. She tells me she was sick yesterday related to not eating enough prior to taking her meds.  We will continue therapy today.   Objective: Vital signs in last 24 hours: Temp:  [97.6 F (36.4 C)-98.1 F (36.7 C)] 97.6 F (36.4 C) (08/24 0635) Pulse Rate:  [76-81] 81 (08/24 0635) Resp:  [16-20] 18 (08/24 0635) BP: (142-158)/(67-86) 155/74 (08/24 0635) SpO2:  [96 %-100 %] 100 % (08/24 0635)  Intake/Output from previous day:  Intake/Output Summary (Last 24 hours) at 01/20/2022 0922 Last data filed at 01/20/2022 0600 Gross per 24 hour  Intake 922 ml  Output --  Net 922 ml     Intake/Output this shift: No intake/output data recorded.  Labs: Recent Labs    01/19/22 0323 01/20/22 0345  HGB 11.4* 11.1*   Recent Labs    01/19/22 0323 01/20/22 0345  WBC 9.9 10.6*  RBC 4.19 4.04  HCT 34.5* 33.4*  PLT 242 238   Recent Labs    01/19/22 0323  NA 138  K 3.8  CL 108  CO2 23  BUN 11  CREATININE 0.74  GLUCOSE 121*  CALCIUM 8.2*   No results for input(s): "LABPT", "INR" in the last 72 hours.  Exam: General - Patient is Alert and Oriented Extremity - Neurologically intact Sensation intact distally Intact pulses distally Dorsiflexion/Plantar flexion intact Dressing - dressing C/D/I Motor Function - intact, moving foot and toes well on exam.   Past Medical History:  Diagnosis Date   Anemia    in past   Arthritis    Cataracts, bilateral    Diarrhea    chronic   Ectopic pregnancy    GERD (gastroesophageal reflux disease)    Hypertension    IBS (irritable bowel syndrome)    Lactose intolerance    OSA on CPAP 2018   Osteoporosis    Plantar  fasciitis    Sickle cell anemia (HCC)    has a trait   Sleep apnea    uses c-pap   Vitamin D deficiency     Assessment/Plan: 2 Days Post-Op Procedure(s) (LRB): TOTAL HIP ARTHROPLASTY ANTERIOR APPROACH (Right) Principal Problem:   S/P total right hip arthroplasty  Estimated body mass index is 33.84 kg/m as calculated from the following:   Height as of this encounter: 5\' 2"  (1.575 m).   Weight as of this encounter: 83.9 kg. Advance diet Up with therapy D/C IV fluids  DVT Prophylaxis - Aspirin Weight bearing as tolerated.  Hgb stable at 11.1 this AM. Will send with zofran in case needed.   Plan is to go Home after hospital stay. Plan for discharge today after meeting goals with therapy. Follow up in the office in 2 weeks.   , PA-C Orthopedic Surgery [phone] 01/20/2022, 9:22 AM

## 2022-02-02 NOTE — Discharge Summary (Signed)
Patient ID: Laura Robles MRN: [id number] DOB/AGE: 1945-06-12 76 y.o.  Admit date: 01/18/2022 Discharge date: 01/20/2022  Admission Diagnoses:  Right hip osteoarthritis   Discharge Diagnoses:  Principal Problem:   S/P total right hip arthroplasty   Past Medical History:  Diagnosis Date   Anemia    in past   Arthritis    Cataracts, bilateral    Diarrhea    chronic   Ectopic pregnancy    GERD (gastroesophageal reflux disease)    Hypertension    IBS (irritable bowel syndrome)    Lactose intolerance    OSA on CPAP 2018   Osteoporosis    Plantar fasciitis    Sickle cell anemia (HCC)    has a trait   Sleep apnea    uses c-pap   Vitamin D deficiency     Surgeries: Procedure(s): TOTAL HIP ARTHROPLASTY ANTERIOR APPROACH on 01/18/2022   Consultants:   Discharged Condition: Improved  Hospital Course: Laura Robles is an 76 y.o. female who was admitted 01/18/2022 for operative treatment ofS/P total right hip arthroplasty. Patient has severe unremitting pain that affects sleep, daily activities, and work/hobbies. After pre-op clearance the patient was taken to the operating room on 01/18/2022 and underwent  Procedure(s): TOTAL HIP ARTHROPLASTY ANTERIOR APPROACH.    Patient was given perioperative antibiotics:  Anti-infectives (From admission, onward)    Start     Dose/Rate Route Frequency Ordered Stop   01/18/22 1430  ceFAZolin (ANCEF) IVPB 2g/100 mL premix        2 g 200 mL/hr over 30 Minutes Intravenous Every 6 hours 01/18/22 1316 01/18/22 2047   01/18/22 0645  ceFAZolin (ANCEF) IVPB 2g/100 mL premix        2 g 200 mL/hr over 30 Minutes Intravenous On call to O.R. 01/18/22 7782 01/18/22 4235        Patient was given sequential compression devices, early ambulation, and chemoprophylaxis to prevent DVT. Patient worked with PT and was meeting their goals regarding safe ambulation and transfers.  Patient benefited maximally from hospital stay and there were no  complications.    Recent vital signs: No data found.   Recent laboratory studies: No results for input(s): "WBC", "HGB", "HCT", "PLT", "NA", "K", "CL", "CO2", "BUN", "CREATININE", "GLUCOSE", "INR", "CALCIUM" in the last 72 hours.  Invalid input(s): "PT", "2"   Discharge Medications:   Allergies as of 01/20/2022       Reactions   Ibuprofen    Pain in stomach/nausea   Penicillins    Pain in stomach Tolerated Cephalosporin Date: 01/18/22        Medication List     STOP taking these medications    acetaminophen 500 MG tablet Commonly known as: TYLENOL   diclofenac 75 MG EC tablet Commonly known as: VOLTAREN       TAKE these medications    ascorbic acid 500 MG tablet Commonly known as: VITAMIN C Take 500 mg by mouth daily.   aspirin 81 MG chewable tablet Chew 1 tablet (81 mg total) by mouth 2 (two) times daily for 28 days.   Calcium 600/Vitamin D3 600-20 MG-MCG Tabs Generic drug: Calcium Carb-Cholecalciferol Take 1 tablet by mouth daily.   docusate sodium 100 MG capsule Commonly known as: COLACE Take 1 capsule (100 mg total) by mouth 2 (two) times daily.   Fish Oil 1000 MG Caps Take 1,000 mg by mouth daily.   HYDROcodone-acetaminophen 5-325 MG tablet Commonly known as: NORCO/VICODIN Take 1 tablet by mouth every 4 (four) hours  as needed for severe pain.   latanoprost 0.005 % ophthalmic solution Commonly known as: XALATAN Place 1 drop into both eyes at bedtime.   lisinopril 40 MG tablet Commonly known as: ZESTRIL Take 40 mg by mouth daily.   loperamide 2 MG tablet Commonly known as: IMODIUM A-D Take 2 mg by mouth 4 (four) times daily as needed for diarrhea or loose stools.   methocarbamol 500 MG tablet Commonly known as: ROBAXIN Take 1 tablet (500 mg total) by mouth every 6 (six) hours as needed for muscle spasms.   ondansetron 4 MG tablet Commonly known as: ZOFRAN Take 1 tablet (4 mg total) by mouth every 6 (six) hours as needed for nausea.    oxybutynin 5 MG tablet Commonly known as: DITROPAN Take 5 mg by mouth 2 (two) times daily.   polyethylene glycol 17 g packet Commonly known as: MIRALAX / GLYCOLAX Take 17 g by mouth daily as needed for mild constipation.   triamcinolone cream 0.1 % Commonly known as: KENALOG Apply 1 Application topically 2 (two) times daily as needed (skin irritation).   VITAMIN B-12 PO Take 2,500 mcg by mouth daily.   Vitamin D3 50 MCG (2000 UT) capsule Take 2,000 Units by mouth daily.   vitamin E 200 UNIT capsule Take 200 Units by mouth daily.               Discharge Care Instructions  (From admission, onward)           Start     Ordered   01/19/22 0000  Change dressing       Comments: Maintain surgical dressing until follow up in the clinic. If the edges start to pull up, may reinforce with tape. If the dressing is no longer working, may remove and cover with gauze and tape, but must keep the area dry and clean.  Call with any questions or concerns.   01/19/22 0800            Diagnostic Studies: DG Pelvis Portable  Result Date: 01/18/2022 CLINICAL DATA:  RIGHT hip arthroplasty EXAM: PORTABLE PELVIS 1 VIEW COMPARISON:  01/18/2022 and prior studies FINDINGS: RIGHT total hip arthroplasty identified without complicating features noted. There is no evidence of acute fracture or dislocation. No focal bony lesions are present. IMPRESSION: RIGHT total hip arthroplasty without complicating features. Electronically Signed   By: Harmon Pier M.D.   On: 01/18/2022 10:23   DG HIP UNILAT WITH PELVIS 1V RIGHT  Result Date: 01/18/2022 CLINICAL DATA:  Right hip replacement EXAM: DG HIP (WITH OR WITHOUT PELVIS) 1V RIGHT COMPARISON:  None Available. FINDINGS: Four C-arm images of the pelvis were obtained. Right hip replacement performed without complication. Femoral head prosthesis not yet in place. IMPRESSION: Right hip replacement Electronically Signed   By: Marlan Palau M.D.   On:  01/18/2022 09:45   DG C-Arm 1-60 Min-No Report  Result Date: 01/18/2022 Fluoroscopy was utilized by the requesting physician.  No radiographic interpretation.    Disposition: Discharge disposition: 01-Home or Self Care       Discharge Instructions     Call MD / Call 911   Complete by: As directed    If you experience chest pain or shortness of breath, CALL 911 and be transported to the hospital emergency room.  If you develope a fever above 101 F, pus (white drainage) or increased drainage or redness at the wound, or calf pain, call your surgeon's office.   Change dressing   Complete by: As  directed    Maintain surgical dressing until follow up in the clinic. If the edges start to pull up, may reinforce with tape. If the dressing is no longer working, may remove and cover with gauze and tape, but must keep the area dry and clean.  Call with any questions or concerns.   Constipation Prevention   Complete by: As directed    Drink plenty of fluids.  Prune juice may be helpful.  You may use a stool softener, such as Colace (over the counter) 100 mg twice a day.  Use MiraLax (over the counter) for constipation as needed.   Diet - low sodium heart healthy   Complete by: As directed    Increase activity slowly as tolerated   Complete by: As directed    Weight bearing as tolerated with assist device (walker, cane, etc) as directed, use it as long as suggested by your surgeon or therapist, typically at least 4-6 weeks.   Post-operative opioid taper instructions:   Complete by: As directed    POST-OPERATIVE OPIOID TAPER INSTRUCTIONS: It is important to wean off of your opioid medication as soon as possible. If you do not need pain medication after your surgery it is ok to stop day one. Opioids include: Codeine, Hydrocodone(Norco, Vicodin), Oxycodone(Percocet, oxycontin) and hydromorphone amongst others.  Long term and even short term use of opiods can cause: Increased pain  response Dependence Constipation Depression Respiratory depression And more.  Withdrawal symptoms can include Flu like symptoms Nausea, vomiting And more Techniques to manage these symptoms Hydrate well Eat regular healthy meals Stay active Use relaxation techniques(deep breathing, meditating, yoga) Do Not substitute Alcohol to help with tapering If you have been on opioids for less than two weeks and do not have pain than it is ok to stop all together.  Plan to wean off of opioids This plan should start within one week post op of your joint replacement. Maintain the same interval or time between taking each dose and first decrease the dose.  Cut the total daily intake of opioids by one tablet each day Next start to increase the time between doses. The last dose that should be eliminated is the evening dose.      TED hose   Complete by: As directed    Use stockings (TED hose) for 2 weeks on both leg(s).  You may remove them at night for sleeping.        Follow-up Information     Durene Romans, MD. Schedule an appointment as soon as possible for a visit in 2 week(s).   Specialty: Orthopedic Surgery Contact information: [address] [place] [address] [phone]                  Signed: Cassandria Anger 02/02/2022, 1:31 PM

## 2022-02-14 ENCOUNTER — Other Ambulatory Visit: Payer: Self-pay | Admitting: Internal Medicine

## 2022-02-14 DIAGNOSIS — Z1231 Encounter for screening mammogram for malignant neoplasm of breast: Secondary | ICD-10-CM

## 2022-03-21 ENCOUNTER — Telehealth: Payer: Self-pay | Admitting: Adult Health

## 2022-03-21 NOTE — Telephone Encounter (Signed)
Called pt and was able to schedule a f/u in November with MD. Np had nothing available this year.

## 2022-03-21 NOTE — Telephone Encounter (Signed)
Last visit 09/16/21

## 2022-03-21 NOTE — Telephone Encounter (Signed)
Repeat HST first

## 2022-03-21 NOTE — Telephone Encounter (Signed)
I spoke with Laura Robles. Since patient has not been seen in over 6 months she will need an updated office visit and then can order another HST.  Please call patient and advise her that she will need an appointment with Jinny Blossom, NP so that a new home sleep study can be ordered (per insurance guidelines) and schedule the appointment with Jinny Blossom, NP.

## 2022-03-21 NOTE — Telephone Encounter (Signed)
noted 

## 2022-03-21 NOTE — Telephone Encounter (Signed)
Pt states she has had her CPAP for over 5 years and would like a Rx written for a new one since she is allowed a new one every 5 years

## 2022-03-28 ENCOUNTER — Other Ambulatory Visit (HOSPITAL_COMMUNITY): Payer: Self-pay

## 2022-03-29 ENCOUNTER — Other Ambulatory Visit (HOSPITAL_COMMUNITY): Payer: Self-pay

## 2022-03-30 ENCOUNTER — Ambulatory Visit
Admission: RE | Admit: 2022-03-30 | Discharge: 2022-03-30 | Disposition: A | Discharge status: Home / Self Care | Payer: Medicare Other | Source: Ambulatory Visit | Attending: Internal Medicine | Admitting: Internal Medicine

## 2022-03-30 DIAGNOSIS — Z1231 Encounter for screening mammogram for malignant neoplasm of breast: Secondary | ICD-10-CM

## 2022-04-26 ENCOUNTER — Ambulatory Visit: Payer: Medicare Other | Admitting: Neurology

## 2022-04-26 ENCOUNTER — Encounter: Payer: Self-pay | Admitting: Neurology

## 2022-04-26 VITALS — BP 165/80 | HR 69 | Ht 64.0 in | Wt 185.0 lb

## 2022-04-26 DIAGNOSIS — G8929 Other chronic pain: Secondary | ICD-10-CM

## 2022-04-26 DIAGNOSIS — M5441 Lumbago with sciatica, right side: Secondary | ICD-10-CM

## 2022-04-26 DIAGNOSIS — M25551 Pain in right hip: Secondary | ICD-10-CM

## 2022-04-26 DIAGNOSIS — G4733 Obstructive sleep apnea (adult) (pediatric): Secondary | ICD-10-CM | POA: Diagnosis not present

## 2022-04-26 NOTE — Progress Notes (Signed)
Subjective:    Patient ID: Laura Robles is a 76 y.o. female.  HPI    Interim history:   Laura Robles is a 76 year old right-handed woman with an underlying medical history of osteoporosis, plantar fasciitis, lactose intolerance, IBS, cataracts, hypertension, vitamin D deficiency and obesity, low back pain, arthritis, status post right total hip replacement in August 2023, who presents for follow-up consultation of her obstructive sleep apnea, established on CPAP therapy. The patient is unaccompanied today. I last saw her in a VV on 09/13/18, at which time she was mildly suboptimal with her PAP compliance.   She saw Clabe Seal, NP on 09/17/2019, at which time her compliance was very good, she was benefiting from treatment and doing well.  She was advised to follow-up routinely in 1 year.    She saw Ward Givens, NP on 09/16/2020 at which time she had excellent compliance with her CPAP and her apnea was very well-controlled.  She was advised to follow-up in 1 year.  She saw Ward Givens on 09/16/2021, at which time she continued to do well on CPAP therapy and she inquired about a new machine, she had received her CPAP machine in 2018.  Today, 04/26/2022: I reviewed her CPAP compliance data from 03/26/2022 through 04/24/2022, which is a total of 30 days, during which time she used her machine 28 days with percent use days greater than 4 hours at 87%, indicating very good compliance with an average usage of 6 hours and 54 minutes, residual AHI at goal at 0.6/h, leak on the higher side with some fluctuation, 95th percentile at 30 L/min on a pressure of 9 cm with EPR of 2.  She reports generally doing well with her CPAP.  Some nights she is very restless, she has had more back pain lately, she saw Dr. Rolena Infante who did not recommend any surgery.  He did recommend potential injection into the back but she canceled that appointment as she was afraid that the injection would not help.  She had previously  undergone hip injection on the right which did not help.  She eventually had a right total hip replacement in August 2023 with Dr. Alvan Dame.  I reviewed the office visit note with Dr. Melina Schools from 04/20/2022.  She had right total hip arthroplasty on 01/18/2022.  She has some residual discomfort in the right hip at times.  She has been taking Robaxin, not every day but when she takes it, she takes it at night and very occasionally during the day.  She has had some short-term memory concerns, she is wondering about over-the-counter supplements for memory.  She admits that she does not often hydrate well with water, some days she will only have 1 bottle of water or less.  She does drink water better when she has a flavoring in it.  She buys the prepackaged flavor pouches.  She may drink 2 or 3 bottles of water per day with flavoring. She is compliant with her CPAP machine, she has not seen an error message but sometimes the connection in the back is loose.  She uses a N30i nasal cushion interface with a small headgear from [employer].  She would like to get a new machine.  Her Epworth sleepiness score is 7 out of 24.   The patient's allergies, current medications, family history, past medical history, past social history, past surgical history and problem list were reviewed and updated as appropriate.    Previously (copied from previous notes for reference):  I saw her on 02/21/2017, at which time we talked about her home sleep test and her CPAP titration study. She was compliant with CPAP and reported doing well with it, felt improved in her sleep quality, sleep consolidation and daytime symptoms.    She saw Ward Givens, nurse practitioner in the interim on 08/22/2017, at which time she was compliant with CPAP and doing well.    I reviewed her CPAP compliance data from 08/13/2018 through 09/11/2018 which is a total of 30 days, during which time she used her machine 19 days with percent used days greater  than 4 hours at 53%, indicating suboptimal compliance with an average usage for days on treatment of 7 hours and 2 minutes, residual AHI at goal at 0.6 per hour, leak acceptable with the 95th percentile at 13 L/m on a pressure of 9 cm with EPR of 2. In the past 90 days she has had better compliance, average usage of 7 hours and 19 minutes, percent used days greater than 4 hours at 70%. Set up date was 12/28/2016.   I first met her on 09/08/2016 at the request of her primary care physician, at which time she reported snoring and excessive daytime somnolence. I suggested we proceed with sleep study testing. Her insurance denied and attended sleep study. She had a home sleep test on 10/17/2016 which showed an AHI of 26.9 per hour, O2 nadir of 77%. Based on her symptoms and test results indicating at least moderate obstructive sleep apnea, I suggested we proceed with an attended CPAP titration. She had a CPAP titration study on 12/01/2016. Sleep efficiency was 80.2%, sleep latency 4 minutes, REM latency mildly reduced at 64.5 minutes, she achieved an increased percentage of slow-wave sleep and REM sleep was normal at 20.2%. She was fitted with a nasal mask and CPAP was titrated from 5 cm to 9 cm. On the final pressure her AHI was 0 per hour with supine REM sleep achieved an O2 nadir of 95%. Based on her test results I prescribed CPAP therapy for home use.   I reviewed her CPAP compliance data from 01/19/2017 through 02/17/2017, which is a total of 30 days, during which time she used her CPAP 27 days with percent used days greater than 4 hours of 90%, indicating excellent compliance with an average usage of 7 hours and 56 minutes, residual AHI 1.1 per hour, leak low with the 95th percentile at 3.2 L/m on a pressure of 9 cm with EPR of 2.    09/08/16: (She) reports snoring and excessive daytime somnolence.  I reviewed your office note from 09/06/2016, which you kindly included. The patient is single, she lives  alone. She quit smoking in 1985, she quit drinking alcohol in 1990, does not drink caffeine on a daily basis. 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 She has 2 grown sons, 7 grandchildren, one great-grandchild and one great grandchild on the way. She does not have a set bedtime and wake time. She watches TV in bed at times. Bedtime may vary from 10 PM to 2 AM. Wake up time varies from 7 AM to 10 AM. She does not have night to night nocturia or recurrent morning headaches. She has she had a bedroom before with her friend on a trip out of town and was told that she has loud snoring and also pauses in her breathing. Her grandchildren also reports for snoring to her. She does not always wake up rested. Epworth sleepiness score is 10 out of 24,  fatigue score is 12 out of 63. She denies restless leg symptoms but has leg aching at times. She is not aware of any family history of OSA.   Her Past Medical History Is Significant For: Past Medical History:  Diagnosis Date   Anemia    in past   Arthritis    Cataracts, bilateral    Diarrhea    chronic   Ectopic pregnancy    GERD (gastroesophageal reflux disease)    Hypertension    IBS (irritable bowel syndrome)    Lactose intolerance    OSA on CPAP 2018   Osteoporosis    Plantar fasciitis    Sickle cell anemia ([place])    has a trait   Sleep apnea    uses c-pap   Vitamin D deficiency     Her Past Surgical History Is Significant For: Past Surgical History:  Procedure Laterality Date   ABDOMINAL HYSTERECTOMY     BREAST BIOPSY Left 03/31/1997   benign   CHOLECYSTECTOMY  2008   COLONOSCOPY  2009   negative screening   TOTAL HIP ARTHROPLASTY Right 01/18/2022   Procedure: TOTAL HIP ARTHROPLASTY ANTERIOR APPROACH;  Surgeon: Paralee Cancel, MD;  Location: [facility];  Service: Orthopedics;  Laterality: Right;   TUBAL LIGATION      Her Family History Is Significant For: Family History  Problem Relation Age of Onset   Heart attack Father    Stroke Father    Stroke  Paternal Grandmother    Diabetes Son    Breast cancer Cousin 90   Sleep apnea Cousin     Her Social History Is Significant For: Social History   Socioeconomic History   Marital status: Single    Spouse name: Not on file   Number of children: 2   Years of education: Not on file   Highest education level: Not on file  Occupational History   Occupation: retired  Tobacco Use   Smoking status: Former    Types: Cigarettes    Quit date: 05/31/1983    Years since quitting: 38.9   Smokeless tobacco: Never  Vaping Use   Vaping Use: Never used  Substance and Sexual Activity   Alcohol use: No   Drug use: No   Sexual activity: Not Currently  Other Topics Concern   Not on file  Social History Narrative   Not on file   Social Determinants of Health   Financial Resource Strain: Not on file  Food Insecurity: Not on file  Transportation Needs: Not on file  Physical Activity: Not on file  Stress: Not on file  Social Connections: Not on file    Her Allergies Are:  Allergies  Allergen Reactions   Ibuprofen     Pain in stomach/nausea   Penicillins     Pain in stomach Tolerated Cephalosporin Date: 01/18/22     Tramadol Other (See Comments)  :   Her Current Medications Are:  Outpatient Encounter Medications as of 04/26/2022  Medication Sig   ascorbic acid (VITAMIN C) 500 MG tablet Take 500 mg by mouth daily.   Calcium Carb-Cholecalciferol (CALCIUM 600/VITAMIN D3) 600-20 MG-MCG TABS Take 1 tablet by mouth daily.   Cholecalciferol (VITAMIN D3) 50 MCG (2000 UT) capsule Take 2,000 Units by mouth daily.   Cyanocobalamin (VITAMIN B-12 PO) Take 2,500 mcg by mouth daily.   docusate sodium (COLACE) 100 MG capsule Take 1 capsule (100 mg total) by mouth 2 (two) times daily.   HYDROcodone-acetaminophen (NORCO/VICODIN) 5-325 MG tablet Take 1 tablet  by mouth every 4 (four) hours as needed for severe pain.   latanoprost (XALATAN) 0.005 % ophthalmic solution Place 1 drop into both eyes at  bedtime.   lisinopril (ZESTRIL) 40 MG tablet Take 40 mg by mouth daily.   loperamide (IMODIUM A-D) 2 MG tablet Take 2 mg by mouth 4 (four) times daily as needed for diarrhea or loose stools.   methocarbamol (ROBAXIN) 500 MG tablet Take 1 tablet (500 mg total) by mouth every 6 (six) hours as needed for muscle spasms.   Omega-3 Fatty Acids (FISH OIL) 1000 MG CAPS Take 1,000 mg by mouth daily.   ondansetron (ZOFRAN) 4 MG tablet Take 1 tablet (4 mg total) by mouth every 6 (six) hours as needed for nausea.   oxybutynin (DITROPAN) 5 MG tablet Take 5 mg by mouth 2 (two) times daily.   polyethylene glycol (MIRALAX / GLYCOLAX) 17 g packet Take 17 g by mouth daily as needed for mild constipation.   triamcinolone cream (KENALOG) 0.1 % Apply 1 Application topically 2 (two) times daily as needed (skin irritation).   vitamin E 200 UNIT capsule Take 200 Units by mouth daily.   No facility-administered encounter medications on file as of 04/26/2022.  :  Review of Systems:  Out of a complete 14 point review of systems, all are reviewed and negative with the exception of these symptoms as listed below:   Review of Systems  Neurological:        Doing ok, needing new machine.  ESS 7.     Objective:  Neurological Exam  Physical Exam Physical Examination:   Vitals:   04/26/22 0731  BP: (!) 165/80  Pulse: 69    General Examination: The patient is a very pleasant 76 y.o. female in no acute distress. She appears well-nourished and no acute distress and well groomed.   HEENT: Normocephalic, atraumatic, pupils are equal, round and reactive to light, corrective eyeglasses in place.  Tracking is well-preserved.  Hearing is grossly intact.  Face is symmetric with normal facial animation.  Speech is clear without dysarthria, hypophonia or voice tremor.  Airway examination reveals moderate mouth dryness, tongue protrudes centrally and palate elevates symmetrically.  No carotid bruits.   Chest: Clear to  auscultation without wheezing, rhonchi or crackles noted.   Heart: S1+S2+0, regular and normal without murmurs, rubs or gallops noted.    Abdomen: Soft, non-tender and non-distended.   Extremities: There is puffiness in the distal lower extremities bilaterally.     Skin: Warm and dry without trophic changes noted.   Musculoskeletal: exam reveals no obvious joint deformities, tenderness or joint swelling or erythema.    Neurologically:  Mental status: The patient is awake, alert and oriented in all 4 spheres. Her immediate and remote memory, attention, language skills and fund of knowledge are appropriate. There is no evidence of aphasia, agnosia, apraxia or anomia. Speech is clear with normal prosody and enunciation. Thought process is linear. Mood is normal and affect is normal.  Cranial nerves II - XII are as described above under HEENT exam. In addition: shoulder shrug is normal with equal shoulder height noted. Motor exam: Normal bulk, strength and tone is noted. There is no drift, tremor or rebound. Romberg is negative. Reflexes are 1-2+ throughout. Fine motor skills and coordination: grossly intact.  Cerebellar testing: No dysmetria or intention tremor. There is no truncal or gait ataxia.  Sensory exam: intact to light touch.  Gait, station and balance: She stands easily. No veering to one side  is noted. No leaning to one side is noted. Posture is age-appropriate and stance is slightly wider base and she walks without a walking aid but with a limp.  She walks slowly and cautiously.   Assessment and Plan:  In summary, ELEFTHERIA TABORN is a 76 year old female with an underlying medical history of osteoporosis, plantar fasciitis, lactose intolerance, IBS, cataracts, hypertension, vitamin D deficiency and obesity, low back pain, arthritis, status post right total hip replacement in August 2023, who presents for follow-up consultation of her obstructive sleep apnea, well-established on CPAP  therapy.  She had a home sleep test on 10/17/2016 followed by a CPAP titration study in house on 12/01/2016. She has done well with CPAP therapy over the years, she is compliant with treatment.  She needs a new machine, machine is over 19 years old.  We will proceed with a home sleep test for reevaluation of her sleep apnea.  She has been on CPAP of 9 cm with good apnea control.  She has had some short-term memory issues which may be from a combination of factors, not sleeping well at times because of back pain and residual hip pain, taking a muscle relaxer during the day at times and not hydrating all that well every day.  She is advised to also follow-up regularly with her primary care and get her thyroid function and B12 level checked as well as her vitamin D level.  We talked about the importance of maintaining a healthy lifestyle and staying active mentally and physically.  She is advised to consider epidural steroid injections if this was recommended by Dr. Rolena Infante.  She is commended for her CPAP treatment adherence.  We will call her after her home sleep test.  She will have an insurance change at the end of this year, if possible, we will try to get this study done before the end of this year or we will have to wait until her new insurance kicks in and she can provide [place] with the insurance information.  We will follow-up within 3 months after set up on the new CPAP machine.  She currently has a [employer] air sense 10, she would like to get the [employer]Med AirSense 11 or stay with the AirSense 10, new machine.  I answered all her questions today and she was in agreement with our plan.  This was an extended visit of over 40 minutes secondary to discussion of back pain, hip pain, memory concerns and sleep apnea.

## 2022-04-26 NOTE — Patient Instructions (Addendum)
It was nice to see you again today.   As discussed, we will proceed with a home sleep test (HST) to re-establish your sleep apnea diagnosis and to get you a new machine. Our sleep lab staff will reach out to you to arrange for pickup and for tutorial of your test equipment - you will do the test at home that night and bring the test sensors back for data analysis the next day or whenever you are scheduled for drop off of your test equipment. I will write for a new machine after your HST confirms your obstructive sleep apnea diagnosis.   Please remember, you will not use your CPAP the night of your testing.  This is so we get diagnostic data, we do not need treatment data.  We will schedule a follow-up appointment after set up with your new machine, typically within 31 to 89 days post treatment start. You will need to show compliance with usage and fulfill a minimum usage percentage (this is an insurance requirement).  After you have done your home sleep test, you can resume using your current machine until you get a new one.   Please continue using your CPAP regularly. While your insurance requires that you use CPAP at least 4 hours each night on 70% of the nights, I recommend, that you not skip any nights and use it throughout the night if you can. Getting used to CPAP and staying with the treatment long term does take time and patience and discipline. Untreated obstructive sleep apnea when it is moderate to severe can have an adverse impact on cardiovascular health and raise her risk for heart disease, arrhythmias, hypertension, congestive heart failure, stroke and diabetes. Untreated obstructive sleep apnea causes sleep disruption, nonrestorative sleep, and sleep deprivation. This can have an impact on your day to day functioning and cause daytime sleepiness and impairment of cognitive function, memory loss, mood disturbance, and problems focussing. Using CPAP regularly can improve these symptoms.  I  recommend that you have your primary care physician check your vitamin B-12 level, vitamin D level and thyroid function.  Please try to hydrate better with water and try to get at least 3 bottles of water in throughout the day.  You may want to consider epidural steroid injection for your low back pain.  Please try to continue to stay active mentally and physically.  We will follow up after your home sleep test.

## 2022-05-03 ENCOUNTER — Telehealth: Payer: Self-pay | Admitting: Neurology

## 2022-05-03 NOTE — Telephone Encounter (Signed)
Patient called back.  HST- [employer] medicare no auth req   She is scheduled at [facility] for 05/24/22 at 1:30 pm.  Mailed packet to the patient.

## 2022-05-03 NOTE — Telephone Encounter (Signed)
[employer] medicare no auth req   left VM 05/02/22 [place]

## 2022-05-24 ENCOUNTER — Ambulatory Visit (INDEPENDENT_AMBULATORY_CARE_PROVIDER_SITE_OTHER): Payer: Medicare Other | Admitting: Neurology

## 2022-05-24 DIAGNOSIS — G4733 Obstructive sleep apnea (adult) (pediatric): Secondary | ICD-10-CM | POA: Diagnosis not present

## 2022-05-25 NOTE — Progress Notes (Signed)
[facility]  HOME SLEEP TEST (Watch PAT) REPORT  STUDY DATE: 05/24/2022  DOB: [birth year]  MRN: [id number]  ORDERING CLINICIAN: Huston Foley, MD, PhD   REFERRING CLINICIAN: Alysia Penna, MD   CLINICAL INFORMATION/HISTORY: 76 year old right-handed woman with an underlying medical history of osteoporosis, plantar fasciitis, lactose intolerance, IBS, cataracts, hypertension, vitamin D deficiency and obesity, low back pain, arthritis, status post right total hip replacement in August 2023, who presents for evaluation of her sleep apnea.  She has been compliant with her CPAP of 9 cm with EPR of 2.  She has had good apnea control, her machine is older and she should be eligible for new equipment.   Epworth sleepiness score: 7/24.  BMI: 31.76 kg/m  FINDINGS:   Sleep Summary:   Total Recording Time (hours, min): 11 hours, 40 min  Total Sleep Time (hours, min):  8 hours, 46 min  Percent REM (%):    30.6%   Respiratory Indices:   Calculated pAHI (per hour):  35.6/hour         REM pAHI:    40/hour       NREM pAHI: 33.5/hour  Central pAHI: 1.6/hour  Oxygen Saturation Statistics:    Oxygen Saturation (%) Mean: 94%   Minimum oxygen saturation (%):                 80%   O2 Saturation Range (%): 80-99%    O2 Saturation (minutes) <=88%: 1.2 min  Pulse Rate Statistics:   Pulse Mean (bpm):    88/min    Pulse Range (75-107/min)   IMPRESSION: OSA (obstructive sleep apnea), severe  RECOMMENDATION:  This home sleep test demonstrates severe obstructive sleep apnea with a total AHI of ***/hour and O2 nadir of ***%.  Snoring was detected, in the ***range.  Treatment with positive airway pressure is highly recommended. This will require - ideally - a full night CPAP titration study for proper treatment settings, O2 monitoring and mask fitting. For now, the patient will be advised to proceed with an autoPAP titration/trial at home. A laboratory attended  titration study can be considered in the future for optimization of treatment settings and to improve tolerance and compliance. Alternative treatment options are limited secondary to the severity of the patient's sleep disordered breathing, but may include surgical treatment with an implantable hypoglossal nerve stimulator (in carefully selected candidates, meeting criteria).  Concomitant weight loss is recommended, where clinically appropriate. Please note, that untreated obstructive sleep apnea may carry additional perioperative morbidity. Patients with significant obstructive sleep apnea should receive perioperative PAP therapy and the surgeons and particularly the anesthesiologist should be informed of the diagnosis and the severity of the sleep disordered breathing. The patient should be cautioned not to drive, work at heights, or operate dangerous or heavy equipment when tired or sleepy. Review and reiteration of good sleep hygiene measures should be pursued with any patient. Other causes of the patient's symptoms, including circadian rhythm disturbances, an underlying mood disorder, medication effect and/or an underlying medical problem cannot be ruled out based on this test. Clinical correlation is recommended.  The patient and *** referring provider will be notified of the test results. The patient will be seen in follow up in sleep clinic at [facility].  I certify that I have reviewed the raw data recording prior to the issuance of this report in accordance with the standards of the American Academy of Sleep Medicine (AASM).    INTERPRETING PHYSICIAN:   Huston Foley, MD, PhD Medical  Director, [facility]) Diplomat, ABPN (Neurology and Sleep)   [facility] [address], [address] [place], [place] [address] [phone]

## 2022-06-01 ENCOUNTER — Telehealth: Payer: Self-pay | Admitting: Neurology

## 2022-06-01 NOTE — Telephone Encounter (Signed)
Ladies - This patient was last seen 03/2022 and an order for a new Cpap was supposed to be sent. I checked the chart but I don't see anything and wanted to followup since she was a walked into the lobby. I can call her back to advise next steps if needed. Thank you

## 2022-06-01 NOTE — Procedures (Signed)
[facility]  HOME SLEEP TEST (Watch PAT) REPORT  STUDY DATE: 05/24/2022  DOB: [birth year]  MRN: [id number]  ORDERING CLINICIAN: Star Age, MD, PhD   REFERRING CLINICIAN: Velna Hatchet, MD   CLINICAL INFORMATION/HISTORY: 77 year old right-handed woman with an underlying medical history of osteoporosis, plantar fasciitis, lactose intolerance, IBS, cataracts, hypertension, vitamin D deficiency and obesity, low back pain, arthritis, status post right total hip replacement in August 2023, who presents for evaluation of her sleep apnea.  She has been compliant with her CPAP of 9 cm with EPR of 2.  She has had good apnea control, her machine is older and she should be eligible for new equipment.   Epworth sleepiness score: 7/24.  BMI: 31.76 kg/m  FINDINGS:   Sleep Summary:   Total Recording Time (hours, min): 11 hours, 40 min  Total Sleep Time (hours, min):  8 hours, 46 min  Percent REM (%):    30.6%   Respiratory Indices:   Calculated pAHI (per hour):  35.6/hour         REM pAHI:    40/hour       NREM pAHI: 33.5/hour  Central pAHI: 1.6/hour  Oxygen Saturation Statistics:    Oxygen Saturation (%) Mean: 94%   Minimum oxygen saturation (%):                 80%   O2 Saturation Range (%): 80-99%    O2 Saturation (minutes) <=88%: 1.2 min  Pulse Rate Statistics:   Pulse Mean (bpm):    88/min    Pulse Range (75-107/min)   IMPRESSION: OSA (obstructive sleep apnea), severe  RECOMMENDATION:  This home sleep test demonstrates severe obstructive sleep apnea with a total AHI of 35.6/hour and O2 nadir of 80%.  Snoring was detected, in the moderate to loud range.  Ongoing treatment with positive airway pressure is highly recommended. The patient has been compliant with CPAP therapy.  She should be eligible for new equipment and I would like to write for a new CPAP machine. A laboratory attended titration study can be considered in the future for  optimization of treatment settings and to improve tolerance and compliance. Alternative treatment options are limited secondary to the severity of the patient's sleep disordered breathing, but may include surgical treatment with an implantable hypoglossal nerve stimulator (in carefully selected candidates, meeting criteria).  Concomitant weight loss is recommended, where clinically appropriate. Please note, that untreated obstructive sleep apnea may carry additional perioperative morbidity. Patients with significant obstructive sleep apnea should receive perioperative PAP therapy and the surgeons and particularly the anesthesiologist should be informed of the diagnosis and the severity of the sleep disordered breathing. The patient should be cautioned not to drive, work at heights, or operate dangerous or heavy equipment when tired or sleepy. Review and reiteration of good sleep hygiene measures should be pursued with any patient. Other causes of the patient's symptoms, including circadian rhythm disturbances, an underlying mood disorder, medication effect and/or an underlying medical problem cannot be ruled out based on this test. Clinical correlation is recommended.  The patient and her referring provider will be notified of the test results. The patient will be seen in follow up in sleep clinic at [facility].  I certify that I have reviewed the raw data recording prior to the issuance of this report in accordance with the standards of the American Academy of Sleep Medicine (AASM).  INTERPRETING PHYSICIAN:   Star Age, MD, PhD Medical Director, [facility] (  [facility]) Diplomat, ABPN (Neurology and Sleep)   [facility] [address] [place],  [address] [phone]

## 2022-06-01 NOTE — Telephone Encounter (Signed)
EError

## 2022-06-01 NOTE — Telephone Encounter (Signed)
Pt had HST done 05-24-2022 so she is inquiring about the results.  It has not been read so we dont have the results yet.  If you could let her know.  It can be up to 10-14 days for it to be read.

## 2022-06-01 NOTE — Addendum Note (Signed)
Addended by: Star Age on: 06/01/2022 08:02 PM   Modules accepted: Orders

## 2022-06-02 ENCOUNTER — Telehealth: Payer: Self-pay | Admitting: *Deleted

## 2022-06-02 NOTE — Telephone Encounter (Signed)
-----   Message from Star Age, MD sent at 06/01/2022  8:01 PM EST ----- Patient was seen on 04/26/2022.  She has been compliant on CPAP therapy but should be eligible for a new machine.  She had a home sleep test for reevaluation on 05/24/2022.  Please advise patient that her sleep test at home confirmed severe obstructive sleep apnea.  She can resume her current CPAP machine at home but I would like to write for a new set of equipment.  We can use her current DME provider if she has a company or we can establish with a new DME company if she prefers.  Please reinforce full compliance and the need for follow-up appointment within 1 to 3 months of set up date.

## 2022-06-02 NOTE — Telephone Encounter (Signed)
Relayed results to of HST.  Her test continued to show severe OSA.  She is amenable to going to another DME  [facility].  She has appt with [facility] about another mask and will keep that but is interested in [facility].  Once she gets a machine she needs to make appt 30-90 days for compliance.

## 2022-06-02 NOTE — Telephone Encounter (Signed)
Orders faxed to [facility] with fax confirmation received. [phone]. Pt asking for airsense 11.

## 2022-06-13 DIAGNOSIS — M4316 Spondylolisthesis, lumbar region: Secondary | ICD-10-CM | POA: Diagnosis not present

## 2022-06-13 DIAGNOSIS — Z96641 Presence of right artificial hip joint: Secondary | ICD-10-CM | POA: Diagnosis not present

## 2022-06-13 DIAGNOSIS — M5136 Other intervertebral disc degeneration, lumbar region: Secondary | ICD-10-CM | POA: Diagnosis not present

## 2022-06-14 DIAGNOSIS — N390 Urinary tract infection, site not specified: Secondary | ICD-10-CM | POA: Diagnosis not present

## 2022-06-15 NOTE — Telephone Encounter (Signed)
Fax confirmation received 18pgs for new machine OSA severe to [facility] [phone].

## 2022-06-16 DIAGNOSIS — G4733 Obstructive sleep apnea (adult) (pediatric): Secondary | ICD-10-CM | POA: Diagnosis not present

## 2022-06-17 DIAGNOSIS — M4805 Spinal stenosis, thoracolumbar region: Secondary | ICD-10-CM | POA: Diagnosis not present

## 2022-06-17 DIAGNOSIS — M5459 Other low back pain: Secondary | ICD-10-CM | POA: Diagnosis not present

## 2022-06-22 DIAGNOSIS — M5459 Other low back pain: Secondary | ICD-10-CM | POA: Diagnosis not present

## 2022-06-22 DIAGNOSIS — M4805 Spinal stenosis, thoracolumbar region: Secondary | ICD-10-CM | POA: Diagnosis not present

## 2022-06-22 NOTE — Telephone Encounter (Signed)
Ok thanks

## 2022-06-22 NOTE — Telephone Encounter (Signed)
Called pt. Re-scheduled her appointment for 4/1 with Dr. Rexene Alberts @ 1:15pm. I informed pt she needed to bring machine and power cord.

## 2022-06-22 NOTE — Telephone Encounter (Addendum)
From [facility]:  Setup on  Resmed Airsense 11 Setup 06/16/22 (appt needed 07/17/22 - 09/15/22)  Call patient and schedule initial f/u. She has an appt scheduled w/ Megan 09/22/22 but it needs to be moved up

## 2022-06-24 DIAGNOSIS — M4805 Spinal stenosis, thoracolumbar region: Secondary | ICD-10-CM | POA: Diagnosis not present

## 2022-06-24 DIAGNOSIS — M5459 Other low back pain: Secondary | ICD-10-CM | POA: Diagnosis not present

## 2022-06-29 DIAGNOSIS — M4805 Spinal stenosis, thoracolumbar region: Secondary | ICD-10-CM | POA: Diagnosis not present

## 2022-06-29 DIAGNOSIS — M5459 Other low back pain: Secondary | ICD-10-CM | POA: Diagnosis not present

## 2022-06-30 ENCOUNTER — Other Ambulatory Visit (HOSPITAL_COMMUNITY): Payer: Self-pay

## 2022-06-30 DIAGNOSIS — I10 Essential (primary) hypertension: Secondary | ICD-10-CM | POA: Diagnosis not present

## 2022-07-01 ENCOUNTER — Other Ambulatory Visit (HOSPITAL_COMMUNITY): Payer: Self-pay

## 2022-07-01 DIAGNOSIS — M4805 Spinal stenosis, thoracolumbar region: Secondary | ICD-10-CM | POA: Diagnosis not present

## 2022-07-01 DIAGNOSIS — M5459 Other low back pain: Secondary | ICD-10-CM | POA: Diagnosis not present

## 2022-07-01 MED ORDER — AMLODIPINE BESYLATE 2.5 MG PO TABS
2.5000 mg | ORAL_TABLET | Freq: Every evening | ORAL | 1 refills | Status: DC
  Filled 2022-07-01: qty 90, 90d supply, fill #0

## 2022-07-05 ENCOUNTER — Other Ambulatory Visit (HOSPITAL_COMMUNITY): Payer: Self-pay

## 2022-07-06 ENCOUNTER — Other Ambulatory Visit (HOSPITAL_COMMUNITY): Payer: Self-pay

## 2022-07-06 DIAGNOSIS — M4805 Spinal stenosis, thoracolumbar region: Secondary | ICD-10-CM | POA: Diagnosis not present

## 2022-07-06 DIAGNOSIS — M5459 Other low back pain: Secondary | ICD-10-CM | POA: Diagnosis not present

## 2022-07-06 MED ORDER — METHOCARBAMOL 500 MG PO TABS
500.0000 mg | ORAL_TABLET | Freq: Four times a day (QID) | ORAL | 0 refills | Status: AC | PRN
Start: 2022-06-01 — End: ?

## 2022-07-06 MED ORDER — LISINOPRIL 40 MG PO TABS
40.0000 mg | ORAL_TABLET | Freq: Every day | ORAL | 0 refills | Status: AC
Start: 2021-09-23 — End: ?

## 2022-07-06 MED ORDER — LATANOPROST 0.005 % OP SOLN
1.0000 [drp] | Freq: Every evening | OPHTHALMIC | 1 refills | Status: DC
  Filled 2022-08-02: qty 10, 100d supply, fill #0

## 2022-07-06 MED ORDER — OXYBUTYNIN CHLORIDE 5 MG PO TABS
5.0000 mg | ORAL_TABLET | Freq: Three times a day (TID) | ORAL | 1 refills | Status: DC
Start: 2022-05-06 — End: 2023-07-21
  Filled 2023-02-04 – 2023-02-28 (×3): qty 270, 90d supply, fill #0

## 2022-07-08 ENCOUNTER — Other Ambulatory Visit (HOSPITAL_COMMUNITY): Payer: Self-pay

## 2022-07-08 DIAGNOSIS — M5459 Other low back pain: Secondary | ICD-10-CM | POA: Diagnosis not present

## 2022-07-08 DIAGNOSIS — M4805 Spinal stenosis, thoracolumbar region: Secondary | ICD-10-CM | POA: Diagnosis not present

## 2022-07-13 DIAGNOSIS — M4805 Spinal stenosis, thoracolumbar region: Secondary | ICD-10-CM | POA: Diagnosis not present

## 2022-07-13 DIAGNOSIS — M5459 Other low back pain: Secondary | ICD-10-CM | POA: Diagnosis not present

## 2022-07-15 DIAGNOSIS — M5459 Other low back pain: Secondary | ICD-10-CM | POA: Diagnosis not present

## 2022-07-15 DIAGNOSIS — M4805 Spinal stenosis, thoracolumbar region: Secondary | ICD-10-CM | POA: Diagnosis not present

## 2022-07-17 DIAGNOSIS — G4733 Obstructive sleep apnea (adult) (pediatric): Secondary | ICD-10-CM | POA: Diagnosis not present

## 2022-07-20 DIAGNOSIS — M4805 Spinal stenosis, thoracolumbar region: Secondary | ICD-10-CM | POA: Diagnosis not present

## 2022-07-20 DIAGNOSIS — M5459 Other low back pain: Secondary | ICD-10-CM | POA: Diagnosis not present

## 2022-07-21 DIAGNOSIS — I10 Essential (primary) hypertension: Secondary | ICD-10-CM | POA: Diagnosis not present

## 2022-07-21 DIAGNOSIS — R079 Chest pain, unspecified: Secondary | ICD-10-CM | POA: Diagnosis not present

## 2022-07-22 DIAGNOSIS — M4805 Spinal stenosis, thoracolumbar region: Secondary | ICD-10-CM | POA: Diagnosis not present

## 2022-07-22 DIAGNOSIS — M5459 Other low back pain: Secondary | ICD-10-CM | POA: Diagnosis not present

## 2022-07-27 DIAGNOSIS — M4805 Spinal stenosis, thoracolumbar region: Secondary | ICD-10-CM | POA: Diagnosis not present

## 2022-07-27 DIAGNOSIS — M5459 Other low back pain: Secondary | ICD-10-CM | POA: Diagnosis not present

## 2022-07-29 DIAGNOSIS — M5459 Other low back pain: Secondary | ICD-10-CM | POA: Diagnosis not present

## 2022-07-29 DIAGNOSIS — M4805 Spinal stenosis, thoracolumbar region: Secondary | ICD-10-CM | POA: Diagnosis not present

## 2022-08-02 ENCOUNTER — Other Ambulatory Visit (HOSPITAL_COMMUNITY): Payer: Self-pay

## 2022-08-02 ENCOUNTER — Other Ambulatory Visit: Payer: Self-pay

## 2022-08-03 DIAGNOSIS — M4805 Spinal stenosis, thoracolumbar region: Secondary | ICD-10-CM | POA: Diagnosis not present

## 2022-08-03 DIAGNOSIS — M5459 Other low back pain: Secondary | ICD-10-CM | POA: Diagnosis not present

## 2022-08-05 DIAGNOSIS — M4805 Spinal stenosis, thoracolumbar region: Secondary | ICD-10-CM | POA: Diagnosis not present

## 2022-08-05 DIAGNOSIS — M5459 Other low back pain: Secondary | ICD-10-CM | POA: Diagnosis not present

## 2022-08-10 DIAGNOSIS — M4805 Spinal stenosis, thoracolumbar region: Secondary | ICD-10-CM | POA: Diagnosis not present

## 2022-08-10 DIAGNOSIS — M5459 Other low back pain: Secondary | ICD-10-CM | POA: Diagnosis not present

## 2022-08-12 DIAGNOSIS — M4805 Spinal stenosis, thoracolumbar region: Secondary | ICD-10-CM | POA: Diagnosis not present

## 2022-08-12 DIAGNOSIS — M5459 Other low back pain: Secondary | ICD-10-CM | POA: Diagnosis not present

## 2022-08-15 ENCOUNTER — Other Ambulatory Visit (HOSPITAL_COMMUNITY): Payer: Self-pay

## 2022-08-15 ENCOUNTER — Other Ambulatory Visit: Payer: Self-pay

## 2022-08-15 DIAGNOSIS — G4733 Obstructive sleep apnea (adult) (pediatric): Secondary | ICD-10-CM | POA: Diagnosis not present

## 2022-08-15 DIAGNOSIS — M4805 Spinal stenosis, thoracolumbar region: Secondary | ICD-10-CM | POA: Diagnosis not present

## 2022-08-15 DIAGNOSIS — M5459 Other low back pain: Secondary | ICD-10-CM | POA: Diagnosis not present

## 2022-08-15 MED ORDER — AMLODIPINE BESYLATE 5 MG PO TABS
5.0000 mg | ORAL_TABLET | Freq: Every evening | ORAL | 1 refills | Status: DC
  Filled 2022-08-15: qty 90, 90d supply, fill #0

## 2022-08-17 DIAGNOSIS — M4805 Spinal stenosis, thoracolumbar region: Secondary | ICD-10-CM | POA: Diagnosis not present

## 2022-08-17 DIAGNOSIS — H401131 Primary open-angle glaucoma, bilateral, mild stage: Secondary | ICD-10-CM | POA: Diagnosis not present

## 2022-08-17 DIAGNOSIS — M5459 Other low back pain: Secondary | ICD-10-CM | POA: Diagnosis not present

## 2022-08-19 ENCOUNTER — Encounter: Payer: Self-pay | Admitting: Cardiovascular Disease

## 2022-08-19 ENCOUNTER — Other Ambulatory Visit (HOSPITAL_COMMUNITY): Payer: Self-pay

## 2022-08-19 ENCOUNTER — Ambulatory Visit: Payer: HMO | Attending: Cardiovascular Disease | Admitting: Cardiovascular Disease

## 2022-08-19 ENCOUNTER — Other Ambulatory Visit: Payer: Self-pay

## 2022-08-19 VITALS — BP 120/70 | HR 81 | Ht 64.0 in | Wt 192.4 lb

## 2022-08-19 DIAGNOSIS — R079 Chest pain, unspecified: Secondary | ICD-10-CM

## 2022-08-19 DIAGNOSIS — Z01812 Encounter for preprocedural laboratory examination: Secondary | ICD-10-CM | POA: Diagnosis not present

## 2022-08-19 DIAGNOSIS — R072 Precordial pain: Secondary | ICD-10-CM | POA: Diagnosis not present

## 2022-08-19 MED ORDER — METOPROLOL TARTRATE 100 MG PO TABS
100.0000 mg | ORAL_TABLET | Freq: Once | ORAL | 0 refills | Status: DC
  Filled 2022-08-19: qty 1, 1d supply, fill #0

## 2022-08-19 NOTE — Patient Instructions (Addendum)
Medication Instructions:  No changes *If you need a refill on your cardiac medications before your next appointment, please call your pharmacy*   Lab Work: Today: BMET If you have labs (blood work) drawn today and your tests are completely normal, you will receive your results only by: [place] (if you have MyChart) OR A paper copy in the mail If you have any lab test that is abnormal or we need to change your treatment, we will call you to review the results.   Testing/Procedures: Your physician has requested that you have an echocardiogram. Echocardiography is a painless test that uses sound waves to create images of your heart. It provides your doctor with information about the size and shape of your heart and how well your heart's chambers and valves are working. This procedure takes approximately one hour. There are no restrictions for this procedure. Please do NOT wear cologne, perfume, aftershave, or lotions (deodorant is allowed). Please arrive 15 minutes prior to your appointment time.  Cardiac CTA - see instructions below.  Follow-Up: At [facility], you and your health needs are our priority.  As part of our continuing mission to provide you with exceptional heart care, we have created designated Provider Care Teams.  These Care Teams include your primary Cardiologist (physician) and Advanced Practice Providers (APPs -  Physician Assistants and Nurse Practitioners) who all work together to provide you with the care you need, when you need it.   Your next appointment:   6 week(s)  Provider:   Lauree Chandler, MD       Your cardiac CT will be scheduled at [facility] [place], [place] [address] [phone]  Please arrive at the [facility] Entrance (Entrance C2) of [facility] 30 minutes prior to test start time. You can use the FREE valet parking offered at entrance C (encouraged to control the heart  rate for the test)  Proceed to the [facility] Radiology Department (first floor) to check-in and test prep.  All radiology patients and guests should use entrance C2 at [facility], accessed from [facility], even though the hospital's physical address listed is [address]      Please follow these instructions carefully (unless otherwise directed):   On the Night Before the Test: Be sure to Drink plenty of water. Do not consume any caffeinated/decaffeinated beverages or chocolate 12 hours prior to your test. Do not take any antihistamines 12 hours prior to your test.  On the Day of the Test: Drink plenty of water until 1 hour prior to the test. Do not eat any food 1 hour prior to test. You may take your regular medications prior to the test.  Take metoprolol (Lopressor) two hours prior to test. FEMALES- please wear underwire-free bra if available, avoid dresses & tight clothing      After the Test: Drink plenty of water. After receiving IV contrast, you may experience a mild flushed feeling. This is normal. On occasion, you may experience a mild rash up to 24 hours after the test. This is not dangerous. If this occurs, you can take Benadryl 25 mg and increase your fluid intake. If you experience trouble breathing, this can be serious. If it is severe call 911 IMMEDIATELY. If it is mild, please call our office. If you take any of these medications: Glipizide/Metformin, Avandament, Glucavance, please do not take 48 hours after completing test unless otherwise instructed.  We will  call to schedule your test 2-4 weeks out understanding that some insurance companies will need an authorization prior to the service being performed.   For non-scheduling related questions, please contact the cardiac imaging nurse navigator should you have any questions/concerns: Marchia Bond, Cardiac Imaging Nurse Navigator Gordy Clement, Cardiac Imaging Nurse  Navigator [facility] Direct Office Dial: [phone]   For scheduling needs, including cancellations and rescheduling, please call [place], [phone].

## 2022-08-19 NOTE — Progress Notes (Signed)
Chief Complaint  Patient presents with   New Patient (Initial Visit)    Chest pain   History of Present Illness: 77 yo female with history of GERD, HTN, IBS, sleep apnea and sickle cell trait here today as a new consult, referred by Earle Gell, NP for the evaluation of chest pain. She had right hip surgery in September 2023 and has been working with PT. She has been having chest pain after several minutes of exertion. The pain is in her upper chest with associated dyspnea. No diaphoresis or nausea. The pain lasts for a few minutes and resolves with rest. No palpitations or LE edema. No prior cardiac workup.   Primary Care Physician: Velna Hatchet, MD (Earle Gell, NP)  Past Medical History:  Diagnosis Date   Anemia    in past   Arthritis    Cataracts, bilateral    Diarrhea    chronic   Ectopic pregnancy    GERD (gastroesophageal reflux disease)    Hypertension    IBS (irritable bowel syndrome)    Lactose intolerance    OSA on CPAP 2018   Osteoporosis    Plantar fasciitis    Sickle cell anemia ([place])    has a trait   Sleep apnea    uses c-pap   Vitamin D deficiency     Past Surgical History:  Procedure Laterality Date   ABDOMINAL HYSTERECTOMY     BREAST BIOPSY Left 03/31/1997   benign   CHOLECYSTECTOMY  2008   COLONOSCOPY  2009   negative screening   TOTAL HIP ARTHROPLASTY Right 01/18/2022   Procedure: TOTAL HIP ARTHROPLASTY ANTERIOR APPROACH;  Surgeon: Paralee Cancel, MD;  Location: [facility];  Service: Orthopedics;  Laterality: Right;   TUBAL LIGATION      Current Outpatient Medications  Medication Sig Dispense Refill   acetaminophen (TYLENOL) 500 MG tablet Take 500 mg by mouth every 6 (six) hours as needed.     amLODipine (NORVASC) 5 MG tablet Take 1 tablet (5 mg total) by mouth every evening. 90 tablet 1   ascorbic acid (VITAMIN C) 500 MG tablet Take 500 mg by mouth daily.     Calcium Carb-Cholecalciferol (CALCIUM 600/VITAMIN D3) 600-20 MG-MCG TABS Take 1  tablet by mouth daily.     Cholecalciferol (VITAMIN D3) 50 MCG (2000 UT) capsule Take 2,000 Units by mouth daily.     Cyanocobalamin (VITAMIN B-12 PO) Take 2,500 mcg by mouth daily.     docusate sodium (COLACE) 100 MG capsule Take 1 capsule (100 mg total) by mouth 2 (two) times daily. 10 capsule 0   HYDROcodone-acetaminophen (NORCO/VICODIN) 5-325 MG tablet Take 1 tablet by mouth every 4 (four) hours as needed for severe pain. 42 tablet 0   latanoprost (XALATAN) 0.005 % ophthalmic solution Place 1 drop into both eyes at bedtime.     latanoprost (XALATAN) 0.005 % ophthalmic solution Place 1 drop into both eyes every evening. 5 mL 1   lisinopril (ZESTRIL) 40 MG tablet Take 1 tablet (40 mg total) by mouth daily. 90 tablet 0   loperamide (IMODIUM A-D) 2 MG tablet Take 2 mg by mouth 4 (four) times daily as needed for diarrhea or loose stools.     methocarbamol (ROBAXIN) 500 MG tablet Take 1 tablet (500 mg total) by mouth every 6 (six) hours as needed for muscles spasm or pain. 90 tablet 0   metoprolol tartrate (LOPRESSOR) 100 MG tablet Take 1 tablet (100 mg total) by mouth once for 1 dose.  Take 90-120 minutes prior to scan. 1 tablet 0   Omega-3 Fatty Acids (FISH OIL) 1000 MG CAPS Take 1,000 mg by mouth daily.     ondansetron (ZOFRAN) 4 MG tablet Take 1 tablet (4 mg total) by mouth every 6 (six) hours as needed for nausea. 20 tablet 0   oxybutynin (DITROPAN) 5 MG tablet Take 5 mg by mouth 2 (two) times daily.     oxybutynin (DITROPAN) 5 MG tablet Take 1 tablet (5 mg total) by mouth 3 (three) times daily. 270 tablet 1   polyethylene glycol (MIRALAX / GLYCOLAX) 17 g packet Take 17 g by mouth daily as needed for mild constipation. 14 each 0   triamcinolone cream (KENALOG) 0.1 % Apply 1 Application topically 2 (two) times daily as needed (skin irritation).     vitamin E 200 UNIT capsule Take 200 Units by mouth daily.     No current facility-administered medications for this visit.    Allergies  Allergen  Reactions   Ibuprofen     Pain in stomach/nausea   Penicillins     Pain in stomach Tolerated Cephalosporin Date: 01/18/22     Tramadol Other (See Comments)    Social History   Socioeconomic History   Marital status: Single    Spouse name: Not on file   Number of children: 2   Years of education: Not on file   Highest education level: Not on file  Occupational History   Occupation: retired   Occupation: Retired-Telephone operator/CNA  Tobacco Use   Smoking status: Former    Types: Cigarettes    Quit date: 05/31/1983    Years since quitting: 39.2   Smokeless tobacco: Never  Vaping Use   Vaping Use: Never used  Substance and Sexual Activity   Alcohol use: No   Drug use: No   Sexual activity: Not Currently  Other Topics Concern   Not on file  Social History Narrative   Not on file   Social Determinants of Health   Financial Resource Strain: Not on file  Food Insecurity: Not on file  Transportation Needs: Not on file  Physical Activity: Not on file  Stress: Not on file  Social Connections: Not on file  Intimate Partner Violence: Not on file    Family History  Problem Relation Age of Onset   Heart attack Father    Stroke Father    Stroke Paternal Grandmother    Diabetes Son    Breast cancer Cousin 30   Sleep apnea Cousin     Review of Systems:  As stated in the HPI and otherwise negative.   BP 120/70   Pulse 81   Ht 5\' 4"  (1.626 m)   Wt 87.3 kg   SpO2 99%   BMI 33.03 kg/m   Physical Examination: General: Well developed, well nourished, NAD  HEENT: OP clear, mucus membranes moist  SKIN: warm, dry. No rashes. Neuro: No focal deficits  Musculoskeletal: Muscle strength 5/5 all ext  Psychiatric: Mood and affect normal  Neck: No JVD, no carotid bruits, no thyromegaly, no lymphadenopathy.  Lungs:Clear bilaterally, no wheezes, rhonci, crackles Cardiovascular: Regular rate and rhythm. No murmurs, gallops or rubs. Abdomen:Soft. Bowel sounds present.  Non-tender.  Extremities: No lower extremity edema. Pulses are 2 + in the bilateral DP/PT.  EKG:  EKG is ordered today. The ekg ordered today demonstrates Sinus, RBBB  Recent Labs: 01/19/2022: BUN 11; Creatinine, Ser 0.74; Potassium 3.8; Sodium 138 01/20/2022: Hemoglobin 11.1; Platelets 238   Lipid Panel  Component Value Date/Time   CHOL 136 08/26/2009 2124   TRIG 161 (H) 08/26/2009 2124   HDL 54 08/26/2009 2124   CHOLHDL 2.5 Ratio 08/26/2009 2124   VLDL 32 08/26/2009 2124   LDLCALC 50 08/26/2009 2124     Wt Readings from Last 3 Encounters:  08/19/22 87.3 kg  04/26/22 83.9 kg  01/18/22 83.9 kg    Assessment and Plan:   1. Chest pain: Her exertional chest pain is worrisome for angina. Risks factors for CAD include age, HTN and former tobacco use. I will arrange a coronary CTA to exclude CAD. BMET today. She will need a beta blocker before her scan. Resting heart rate is 81 bpm today. Will also arrange an echo to assess LV size and function, exclude structural heart disease.   Labs/ tests ordered today include:   Orders Placed This Encounter  Procedures   CT CORONARY MORPH W/CTA COR W/SCORE W/CA W/CM &/OR WO/CM   Basic Metabolic Panel (BMET)   EKG 12-Lead   ECHOCARDIOGRAM COMPLETE   Disposition:   F/U with me in 4-6 weeks   Signed, Lauree Chandler, MD, [facility] 08/19/2022 2:09 PM    [facility] [place], [place], [place]  [address] Phone: [phone]; Fax: [phone]

## 2022-08-20 LAB — BASIC METABOLIC PANEL
BUN/Creatinine Ratio: 15 (ref 12–28)
BUN: 10 mg/dL (ref 8–27)
CO2: 23 mmol/L (ref 20–29)
Calcium: 9.2 mg/dL (ref 8.7–10.3)
Chloride: 108 mmol/L — ABNORMAL HIGH (ref 96–106)
Creatinine, Ser: 0.68 mg/dL (ref 0.57–1.00)
Glucose: 90 mg/dL (ref 70–99)
Potassium: 4.2 mmol/L (ref 3.5–5.2)
Sodium: 144 mmol/L (ref 134–144)
eGFR: 91 mL/min/{1.73_m2} (ref >59)

## 2022-08-22 ENCOUNTER — Other Ambulatory Visit (HOSPITAL_COMMUNITY): Payer: Self-pay

## 2022-08-24 ENCOUNTER — Telehealth (HOSPITAL_COMMUNITY): Payer: Self-pay | Admitting: *Deleted

## 2022-08-24 ENCOUNTER — Other Ambulatory Visit (HOSPITAL_BASED_OUTPATIENT_CLINIC_OR_DEPARTMENT_OTHER): Payer: Self-pay

## 2022-08-24 ENCOUNTER — Other Ambulatory Visit (HOSPITAL_COMMUNITY): Payer: Self-pay

## 2022-08-24 DIAGNOSIS — M4805 Spinal stenosis, thoracolumbar region: Secondary | ICD-10-CM | POA: Diagnosis not present

## 2022-08-24 DIAGNOSIS — M5459 Other low back pain: Secondary | ICD-10-CM | POA: Diagnosis not present

## 2022-08-24 NOTE — Telephone Encounter (Signed)
Attempted to call patient regarding upcoming cardiac CT appointment. °Left message on voicemail with name and callback number ° °Albertha Beattie RN Navigator Cardiac Imaging °[facility] °[phone] Office °[phone] Cell ° °

## 2022-08-25 ENCOUNTER — Ambulatory Visit (HOSPITAL_COMMUNITY)
Admission: RE | Admit: 2022-08-25 | Discharge: 2022-08-25 | Disposition: A | Discharge status: Home / Self Care | Payer: HMO | Source: Ambulatory Visit | Attending: Cardiovascular Disease | Admitting: Cardiovascular Disease

## 2022-08-25 DIAGNOSIS — R072 Precordial pain: Secondary | ICD-10-CM | POA: Diagnosis not present

## 2022-08-25 MED ORDER — IOHEXOL 350 MG/ML SOLN
100.0000 mL | Freq: Once | INTRAVENOUS | Status: AC | PRN
  Administered 2022-08-25: 100 mL via INTRAVENOUS

## 2022-08-25 MED ORDER — NITROGLYCERIN 0.4 MG SL SUBL: 0.8000 mg | SUBLINGUAL_TABLET | Freq: Once | SUBLINGUAL | Status: AC

## 2022-08-25 MED ORDER — METOPROLOL TARTRATE 5 MG/5ML IV SOLN
INTRAVENOUS | Status: AC
  Filled 2022-08-25: qty 5

## 2022-08-25 MED ORDER — METOPROLOL TARTRATE 5 MG/5ML IV SOLN: 5.0000 mg | INTRAVENOUS | Status: DC | PRN

## 2022-08-25 MED ORDER — NITROGLYCERIN 0.4 MG SL SUBL
SUBLINGUAL_TABLET | SUBLINGUAL | Status: AC
  Administered 2022-08-25: 0.8 mg via SUBLINGUAL
  Filled 2022-08-25: qty 2

## 2022-08-26 DIAGNOSIS — M4805 Spinal stenosis, thoracolumbar region: Secondary | ICD-10-CM | POA: Diagnosis not present

## 2022-08-26 DIAGNOSIS — M5459 Other low back pain: Secondary | ICD-10-CM | POA: Diagnosis not present

## 2022-08-29 ENCOUNTER — Encounter: Payer: Self-pay | Admitting: Neurology

## 2022-08-29 ENCOUNTER — Ambulatory Visit (INDEPENDENT_AMBULATORY_CARE_PROVIDER_SITE_OTHER): Payer: HMO | Admitting: Neurology

## 2022-08-29 VITALS — BP 102/66 | HR 82 | Ht 64.0 in | Wt 185.0 lb

## 2022-08-29 DIAGNOSIS — I10 Essential (primary) hypertension: Secondary | ICD-10-CM | POA: Diagnosis not present

## 2022-08-29 DIAGNOSIS — G4733 Obstructive sleep apnea (adult) (pediatric): Secondary | ICD-10-CM

## 2022-08-29 DIAGNOSIS — K219 Gastro-esophageal reflux disease without esophagitis: Secondary | ICD-10-CM | POA: Diagnosis not present

## 2022-08-29 DIAGNOSIS — R079 Chest pain, unspecified: Secondary | ICD-10-CM | POA: Diagnosis not present

## 2022-08-29 NOTE — Progress Notes (Signed)
Subjective:    Patient ID: Laura Robles is a 77 y.o. female.  HPI    Interim history:   Laura Robles is a 77 year old right-handed woman with an underlying medical history of osteoporosis, plantar fasciitis, lactose intolerance, IBS, cataracts, hypertension, vitamin D deficiency and obesity, low back pain, arthritis, status post right total hip replacement in August 2023, who presents for follow-up consultation of her obstructive sleep apnea, after starting a new CPAP machine, post interim HST. The patient is unaccompanied today. I last saw her on 04/26/2022, at which time she reported generally doing well with her CPAP.  She had had more back pain, had seen Dr. Rolena Infante who did not recommend any surgery. She had a right total hip replacement in August 2023 with Dr. Alvan Dame. She was compliant with her CPAP machine, and was eligible for new machine.  She was advised to proceed with a home sleep test.  She had a home sleep test on 05/24/2022 which showed an AHI of 35.6/h, O2 nadir 80% with moderate to loud snoring detected.  I wrote for a new CPAP machine.  Her set up date was 06/16/2022, she has a ResMed air sense [place], DME company is [facility].   Today, 08/29/2022: I reviewed her CPAP compliance data from 07/26/2022 through 08/24/2022, which is a total of 30 days, during which time she used her machine every night with percent use days greater than 4 hours at 100%, indicating superb compliance with an average usage of 8 hours and 15 minutes, residual AHI at goal at 0.9/h, leak on the higher side with the 95th percentile at 17.6 L/min with fluctuation of her air leak.  Pressure at 9 cm with EPR of 2.  She reports doing well, likes her new machine and has adjusted to its features.  She continues to benefit from treatment.  She has had back pain, she is currently in physical therapy about twice a week.  From the hip standpoint she is doing well.  She has had intermittent chest tightness, at one  point she was suspected to have gas.  Cardiology workup was benign thus far.  She does have irritable bowel syndrome.  She admits that she does not always hydrate well with water.  She uses a nasal cushion interface, ResMed N30i, but switched from medium to small-wide.  She has changed the nasal cushion insert and has changed her filter already.  She is up-to-date with her supplies.  The patient's allergies, current medications, family history, past medical history, past social history, past surgical history and problem list were reviewed and updated as appropriate.    Previously (copied from previous notes for reference):    I saw her in a VV on 09/13/18, at which time she was mildly suboptimal with her PAP compliance.    She saw Clabe Seal, NP on 09/17/2019, at which time her compliance was very good, she was benefiting from treatment and doing well.  She was advised to follow-up routinely in 1 year.     She saw Ward Givens, NP on 09/16/2020 at which time she had excellent compliance with her CPAP and her apnea was very well-controlled.  She was advised to follow-up in 1 year.   She saw Ward Givens, NP on 09/16/2021, at which time she continued to do well on CPAP therapy and she inquired about a new machine, she had received her CPAP machine in 2018.   I reviewed her CPAP compliance data from 03/26/2022 through 04/24/2022, which is a  total of 30 days, during which time she used her machine 28 days with percent use days greater than 4 hours at 87%, indicating very good compliance with an average usage of 6 hours and 54 minutes, residual AHI at goal at 0.6/h, leak on the higher side with some fluctuation, 95th percentile at 30 L/min on a pressure of 9 cm with EPR of 2.     I saw her on 02/21/2017, at which time we talked about her home sleep test and her CPAP titration study. She was compliant with CPAP and reported doing well with it, felt improved in her sleep quality, sleep consolidation and  daytime symptoms.    She saw Ward Givens, nurse practitioner in the interim on 08/22/2017, at which time she was compliant with CPAP and doing well.    I reviewed her CPAP compliance data from 08/13/2018 through 09/11/2018 which is a total of 30 days, during which time she used her machine 19 days with percent used days greater than 4 hours at 53%, indicating suboptimal compliance with an average usage for days on treatment of 7 hours and 2 minutes, residual AHI at goal at 0.6 per hour, leak acceptable with the 95th percentile at 13 L/m on a pressure of 9 cm with EPR of 2. In the past 90 days she has had better compliance, average usage of 7 hours and 19 minutes, percent used days greater than 4 hours at 70%. Set up date was 12/28/2016.   I first met her on 09/08/2016 at the request of her primary care physician, at which time she reported snoring and excessive daytime somnolence. I suggested we proceed with sleep study testing. Her insurance denied and attended sleep study. She had a home sleep test on 10/17/2016 which showed an AHI of 26.9 per hour, O2 nadir of 77%. Based on her symptoms and test results indicating at least moderate obstructive sleep apnea, I suggested we proceed with an attended CPAP titration. She had a CPAP titration study on 12/01/2016. Sleep efficiency was 80.2%, sleep latency 4 minutes, REM latency mildly reduced at 64.5 minutes, she achieved an increased percentage of slow-wave sleep and REM sleep was normal at 20.2%. She was fitted with a nasal mask and CPAP was titrated from 5 cm to 9 cm. On the final pressure her AHI was 0 per hour with supine REM sleep achieved an O2 nadir of 95%. Based on her test results I prescribed CPAP therapy for home use.   I reviewed her CPAP compliance data from 01/19/2017 through 02/17/2017, which is a total of 30 days, during which time she used her CPAP 27 days with percent used days greater than 4 hours of 90%, indicating excellent compliance  with an average usage of 7 hours and 56 minutes, residual AHI 1.1 per hour, leak low with the 95th percentile at 3.2 L/m on a pressure of 9 cm with EPR of 2.    09/08/16: (She) reports snoring and excessive daytime somnolence.  I reviewed your office note from 09/06/2016, which you kindly included. The patient is single, she lives alone. She quit smoking in 1985, she quit drinking alcohol in 1990, does not drink caffeine on a daily basis. 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 She has 2 grown sons, 7 grandchildren, one great-grandchild and one great grandchild on the way. She does not have a set bedtime and wake time. She watches TV in bed at times. Bedtime may vary from 10 PM to 2 AM. Wake up time varies from  7 AM to 10 AM. She does not have night to night nocturia or recurrent morning headaches. She has she had a bedroom before with her friend on a trip out of town and was told that she has loud snoring and also pauses in her breathing. Her grandchildren also reports for snoring to her. She does not always wake up rested. Epworth sleepiness score is 10 out of 24, fatigue score is 12 out of 63. She denies restless leg symptoms but has leg aching at times. She is not aware of any family history of OSA.    Her Past Medical History Is Significant For: Past Medical History:  Diagnosis Date   Anemia    in past   Arthritis    Cataracts, bilateral    Diarrhea    chronic   Ectopic pregnancy    GERD (gastroesophageal reflux disease)    Hypertension    IBS (irritable bowel syndrome)    Lactose intolerance    OSA on CPAP 2018   Osteoporosis    Plantar fasciitis    Sickle cell anemia    has a trait   Sleep apnea    uses c-pap   Vitamin D deficiency     Her Past Surgical History Is Significant For: Past Surgical History:  Procedure Laterality Date   ABDOMINAL HYSTERECTOMY     BREAST BIOPSY Left 03/31/1997   benign   CHOLECYSTECTOMY  2008   COLONOSCOPY  2009   negative screening   TOTAL HIP ARTHROPLASTY  Right 01/18/2022   Procedure: TOTAL HIP ARTHROPLASTY ANTERIOR APPROACH;  Surgeon: Paralee Cancel, MD;  Location: [facility];  Service: Orthopedics;  Laterality: Right;   TUBAL LIGATION      Her Family History Is Significant For: Family History  Problem Relation Age of Onset   Heart attack Father    Stroke Father    Stroke Paternal Grandmother    Diabetes Son    Breast cancer Cousin 46   Sleep apnea Cousin     Her Social History Is Significant For: Social History   Socioeconomic History   Marital status: Single    Spouse name: Not on file   Number of children: 2   Years of education: Not on file   Highest education level: Not on file  Occupational History   Occupation: retired   Occupation: Retired-Telephone operator/CNA  Tobacco Use   Smoking status: Former    Types: Cigarettes    Quit date: 05/31/1983    Years since quitting: 39.2   Smokeless tobacco: Never  Vaping Use   Vaping Use: Never used  Substance and Sexual Activity   Alcohol use: No   Drug use: No   Sexual activity: Not Currently  Other Topics Concern   Not on file  Social History Narrative   Caffeine: tea and hot chocolate    Social Determinants of Health   Financial Resource Strain: Not on file  Food Insecurity: Not on file  Transportation Needs: Not on file  Physical Activity: Not on file  Stress: Not on file  Social Connections: Not on file    Her Allergies Are:  Allergies  Allergen Reactions   Ibuprofen     Pain in stomach/nausea   Penicillins     Pain in stomach Tolerated Cephalosporin Date: 01/18/22     Nsaids     Doctor's order not to take nsaids due to problems with stomach ? Has IBS   Tramadol Other (See Comments)  :   Her Current Medications Are:  Outpatient Encounter Medications as of 08/29/2022  Medication Sig   acetaminophen (TYLENOL) 500 MG tablet Take 500 mg by mouth every 6 (six) hours as needed.   amLODipine (NORVASC) 5 MG tablet Take 1 tablet (5 mg total) by mouth every  evening.   ascorbic acid (VITAMIN C) 500 MG tablet Take 500 mg by mouth daily.   Calcium Carb-Cholecalciferol (CALCIUM 600/VITAMIN D3) 600-20 MG-MCG TABS Take 1 tablet by mouth daily.   Cholecalciferol (VITAMIN D-3 PO) Take 1,000 Units by mouth daily.   Cyanocobalamin (VITAMIN B-12 PO) Take 2,500 mcg by mouth daily.   docusate sodium (COLACE) 100 MG capsule Take 1 capsule (100 mg total) by mouth 2 (two) times daily. (Patient taking differently: Take 100 mg by mouth 2 (two) times daily. As needed)   latanoprost (XALATAN) 0.005 % ophthalmic solution Place 1 drop into both eyes every evening.   lisinopril (ZESTRIL) 40 MG tablet Take 1 tablet (40 mg total) by mouth daily.   loperamide (IMODIUM A-D) 2 MG tablet Take 2 mg by mouth 4 (four) times daily as needed for diarrhea or loose stools.   methocarbamol (ROBAXIN) 500 MG tablet Take 1 tablet (500 mg total) by mouth every 6 (six) hours as needed for muscles spasm or pain.   Omega-3 Fatty Acids (FISH OIL) 1000 MG CAPS Take 1,000 mg by mouth daily.   ondansetron (ZOFRAN) 4 MG tablet Take 1 tablet (4 mg total) by mouth every 6 (six) hours as needed for nausea.   oxybutynin (DITROPAN) 5 MG tablet Take 1 tablet (5 mg total) by mouth 3 (three) times daily. (Patient taking differently: Take 5 mg by mouth 3 (three) times daily. Usually takes 2 times daily)   HYDROcodone-acetaminophen (NORCO/VICODIN) 5-325 MG tablet Take 1 tablet by mouth every 4 (four) hours as needed for severe pain. (Patient not taking: Reported on 08/29/2022)   metoprolol tartrate (LOPRESSOR) 100 MG tablet Take 1 tablet (100 mg total) by mouth once for 1 dose. Take 90-120 minutes prior to scan.   triamcinolone cream (KENALOG) 0.1 % Apply 1 Application topically 2 (two) times daily as needed (skin irritation). (Patient not taking: Reported on 08/29/2022)   vitamin E 200 UNIT capsule Take 200 Units by mouth daily. (Patient not taking: Reported on 08/29/2022)   [DISCONTINUED] Cholecalciferol (VITAMIN  D3) 50 MCG (2000 UT) capsule Take 2,000 Units by mouth daily. (Patient not taking: Reported on 08/29/2022)   [DISCONTINUED] latanoprost (XALATAN) 0.005 % ophthalmic solution Place 1 drop into both eyes at bedtime. (Patient not taking: Reported on 08/29/2022)   [DISCONTINUED] oxybutynin (DITROPAN) 5 MG tablet Take 5 mg by mouth 2 (two) times daily.   [DISCONTINUED] polyethylene glycol (MIRALAX / GLYCOLAX) 17 g packet Take 17 g by mouth daily as needed for mild constipation. (Patient not taking: Reported on 08/29/2022)   No facility-administered encounter medications on file as of 08/29/2022.  :  Review of Systems:  Out of a complete 14 point review of systems, all are reviewed and negative with the exception of these symptoms as listed below:  Review of Systems  Neurological:        Patient is here alone for follow-up for her new PAP machine. She states overall she is tolerating it well so far. Of note, she has been experiencing tightness in her chest. She is seeing primary care and cardiology. She recently had a CT and was told her heart looked good. ESS 10     Objective:  Neurological Exam  Physical Exam Physical Examination:  Vitals:   08/29/22 1311  BP: 102/66  Pulse: 82    General Examination: The patient is a very pleasant 77 y.o. female in no acute distress. She appears well-developed and well-nourished and well groomed.   HEENT: Normocephalic, atraumatic, pupils are equal, round and reactive to light, corrective eyeglasses in place.  Tracking is well-preserved.  Hearing is grossly intact.  Face is symmetric with normal facial animation.  Speech is clear without dysarthria, hypophonia or voice tremor.  Airway examination reveals mild mouth dryness, tongue protrudes centrally and palate elevates symmetrically.  No carotid bruits.   Chest: Clear to auscultation without wheezing, rhonchi or crackles noted.   Heart: S1+S2+0, regular and normal without murmurs, rubs or gallops noted.     Abdomen: Soft, non-tender and non-distended.   Extremities: There is puffiness in the distal lower extremities bilaterally.     Skin: Warm and dry without trophic changes noted.   Musculoskeletal: exam reveals no obvious joint deformities.  Reports some back discomfort.   Neurologically:  Mental status: The patient is awake, alert and oriented in all 4 spheres. Her immediate and remote memory, attention, language skills and fund of knowledge are appropriate. There is no evidence of aphasia, agnosia, apraxia or anomia. Speech is clear with normal prosody and enunciation. Thought process is linear. Mood is normal and affect is normal.  Cranial nerves II - XII are as described above under HEENT exam. In addition: shoulder shrug is normal with equal shoulder height noted. Motor exam: Normal bulk, strength and tone is noted. There is no drift, tremor or rebound. Romberg is negative. Fine motor skills and coordination: grossly intact.  Cerebellar testing: No dysmetria or intention tremor. There is no truncal or gait ataxia.  Sensory exam: intact to light touch.  Gait, station and balance: She stands easily. No veering to one side is noted. No leaning to one side is noted. Posture is age-appropriate and stance is slightly wider base and she walks without a walking aid, no obvious limp today.     Assessment and Plan:  In summary, Laura Robles is a 77 year old female with an underlying medical history of osteoporosis, plantar fasciitis, lactose intolerance, IBS, cataracts, hypertension, vitamin D deficiency and obesity, low back pain, arthritis, status post right total hip replacement in August 2023, who presents for follow-up consultation of her obstructive sleep apnea, after starting a new CPAP machine, post interim HST. She had a home sleep test on 05/24/2022 which showed an AHI of 35.6/h, O2 nadir 80% with moderate to loud snoring detected.  She established treatment with her new CPAP machine on  06/16/2022, she has a ResMed air sense [place], DME company is [facility].  She is fully compliant with treatment and benefits from it.  She uses a nasal cushion interface from [place], N30i, SW. She is commended for her treatment adherence with her new CPAP machine.  She is up-to-date with her supplies and very motivated to continue with treatment.  She has recently had some cardiac workup and has seen cardiology and primary care for chest discomfort.  From the sleep apnea standpoint she can follow-up routinely in 1 year, she can see one of our nurse practitioners.  Of note, she had a prior  home sleep test on 10/17/2016 followed by a CPAP titration study in house on 12/01/2016.  I answered all her questions today and she was in agreement with our plan. I spent 30 minutes in total face-to-face time and in reviewing records during  pre-charting, more than 50% of which was spent in counseling and coordination of care, reviewing test results, reviewing medications and treatment regimen and/or in discussing or reviewing the diagnosis of OSA, the prognosis and treatment options. Pertinent laboratory and imaging test results that were available during this visit with the patient were reviewed by me and considered in my medical decision making (see chart for details).

## 2022-08-29 NOTE — Patient Instructions (Signed)
It was nice to see you again today. I am glad to hear, things are going well with your CPAP therapy. You have adjusted well to treatment with your new machine, and you are compliant with it. You have also fulfilled the insurance-mandated compliance percentage, which is reassuring, so you can get ongoing supplies through your insurance. Please talk to your DME provider about getting replacement supplies on a regular basis. Please be sure to change your filter every month, your mask about every 3 months (nasal pillow inserts monthly), hose about every 6 months, humidifier chamber about yearly. Some restrictions are imposed by your insurance carrier with regard to how frequently you can get certain supplies.  Your DME company can provide further details if necessary.    Please continue using your CPAP regularly. While your insurance requires that you use CPAP at least 4 hours each night on 70% of the nights, I recommend, that you not skip any nights and use it throughout the night if you can. Getting used to CPAP and staying with the treatment long term does take time and patience and discipline. Untreated obstructive sleep apnea when it is moderate to severe can have an adverse impact on cardiovascular health and raise her risk for heart disease, arrhythmias, hypertension, congestive heart failure, stroke and diabetes. Untreated obstructive sleep apnea causes sleep disruption, nonrestorative sleep, and sleep deprivation. This can have an impact on your day to day functioning and cause daytime sleepiness and impairment of cognitive function, memory loss, mood disturbance, and problems focussing. Using CPAP regularly can improve these symptoms. We can see you in 1 year, you can see one of our nurse practitioners as you are stable. 

## 2022-08-30 ENCOUNTER — Other Ambulatory Visit (HOSPITAL_COMMUNITY): Payer: Self-pay

## 2022-08-30 MED ORDER — AMLODIPINE BESYLATE 10 MG PO TABS
10.0000 mg | ORAL_TABLET | Freq: Every evening | ORAL | 3 refills | Status: DC
  Filled 2022-10-05: qty 90, 90d supply, fill #0
  Filled 2022-12-23: qty 90, 90d supply, fill #1
  Filled 2023-03-28: qty 90, 90d supply, fill #2
  Filled 2023-06-26: qty 90, 90d supply, fill #3

## 2022-08-30 MED ORDER — PANTOPRAZOLE SODIUM 40 MG PO TBEC
40.0000 mg | DELAYED_RELEASE_TABLET | Freq: Every day | ORAL | 1 refills | Status: DC
  Filled 2022-08-30: qty 30, 30d supply, fill #0
  Filled 2022-09-26: qty 30, 30d supply, fill #1

## 2022-08-31 ENCOUNTER — Other Ambulatory Visit: Payer: Self-pay

## 2022-08-31 DIAGNOSIS — M4805 Spinal stenosis, thoracolumbar region: Secondary | ICD-10-CM | POA: Diagnosis not present

## 2022-08-31 DIAGNOSIS — M5459 Other low back pain: Secondary | ICD-10-CM | POA: Diagnosis not present

## 2022-09-02 DIAGNOSIS — M5459 Other low back pain: Secondary | ICD-10-CM | POA: Diagnosis not present

## 2022-09-02 DIAGNOSIS — M4805 Spinal stenosis, thoracolumbar region: Secondary | ICD-10-CM | POA: Diagnosis not present

## 2022-09-07 DIAGNOSIS — M5459 Other low back pain: Secondary | ICD-10-CM | POA: Diagnosis not present

## 2022-09-07 DIAGNOSIS — M4805 Spinal stenosis, thoracolumbar region: Secondary | ICD-10-CM | POA: Diagnosis not present

## 2022-09-09 DIAGNOSIS — M5459 Other low back pain: Secondary | ICD-10-CM | POA: Diagnosis not present

## 2022-09-09 DIAGNOSIS — M4805 Spinal stenosis, thoracolumbar region: Secondary | ICD-10-CM | POA: Diagnosis not present

## 2022-09-14 DIAGNOSIS — M5459 Other low back pain: Secondary | ICD-10-CM | POA: Diagnosis not present

## 2022-09-14 DIAGNOSIS — M4805 Spinal stenosis, thoracolumbar region: Secondary | ICD-10-CM | POA: Diagnosis not present

## 2022-09-15 DIAGNOSIS — G4733 Obstructive sleep apnea (adult) (pediatric): Secondary | ICD-10-CM | POA: Diagnosis not present

## 2022-09-16 DIAGNOSIS — M4805 Spinal stenosis, thoracolumbar region: Secondary | ICD-10-CM | POA: Diagnosis not present

## 2022-09-16 DIAGNOSIS — M5459 Other low back pain: Secondary | ICD-10-CM | POA: Diagnosis not present

## 2022-09-20 ENCOUNTER — Ambulatory Visit (HOSPITAL_COMMUNITY): Payer: HMO | Attending: Cardiovascular Disease

## 2022-09-20 DIAGNOSIS — R072 Precordial pain: Secondary | ICD-10-CM | POA: Diagnosis not present

## 2022-09-20 LAB — ECHOCARDIOGRAM COMPLETE
Area-P 1/2: 4.49 cm2
S' Lateral: 2.1 cm

## 2022-09-22 ENCOUNTER — Ambulatory Visit: Payer: Medicare Other | Admitting: Adult Health

## 2022-09-22 ENCOUNTER — Telehealth: Payer: Self-pay | Admitting: Cardiovascular Disease

## 2022-09-22 DIAGNOSIS — M4805 Spinal stenosis, thoracolumbar region: Secondary | ICD-10-CM | POA: Diagnosis not present

## 2022-09-22 DIAGNOSIS — M5459 Other low back pain: Secondary | ICD-10-CM | POA: Diagnosis not present

## 2022-09-22 NOTE — Telephone Encounter (Signed)
Spoke with patient to review results of ECHO and answer questions. Patient verbalized understanding and had no questions.

## 2022-09-22 NOTE — Telephone Encounter (Signed)
Patient is following-up on Echocardiogram test results.  Patient also wants to know if she needs to be scheduled for a stress test.

## 2022-09-23 DIAGNOSIS — M4805 Spinal stenosis, thoracolumbar region: Secondary | ICD-10-CM | POA: Diagnosis not present

## 2022-09-23 DIAGNOSIS — M5459 Other low back pain: Secondary | ICD-10-CM | POA: Diagnosis not present

## 2022-09-26 ENCOUNTER — Other Ambulatory Visit (HOSPITAL_COMMUNITY): Payer: Self-pay

## 2022-09-26 ENCOUNTER — Other Ambulatory Visit: Payer: Self-pay | Admitting: Cardiovascular Disease

## 2022-09-26 IMAGING — MG MM DIGITAL DIAGNOSTIC UNILAT*R* W/ TOMO W/ CAD
4 series · 4 of 12 positions shown · non-contrast
Comparison: Previous exam(s).

CLINICAL DATA: Patient describes generalized intermittent sharp
RIGHT breast pain for 3-4 weeks.

EXAM:
DIGITAL DIAGNOSTIC UNILATERAL RIGHT MAMMOGRAM WITH TOMOSYNTHESIS AND
CAD
TECHNIQUE: Right digital diagnostic mammography and breast tomosynthesis was
performed. The images were evaluated with computer-aided detection.

[R MLO synth-2D]
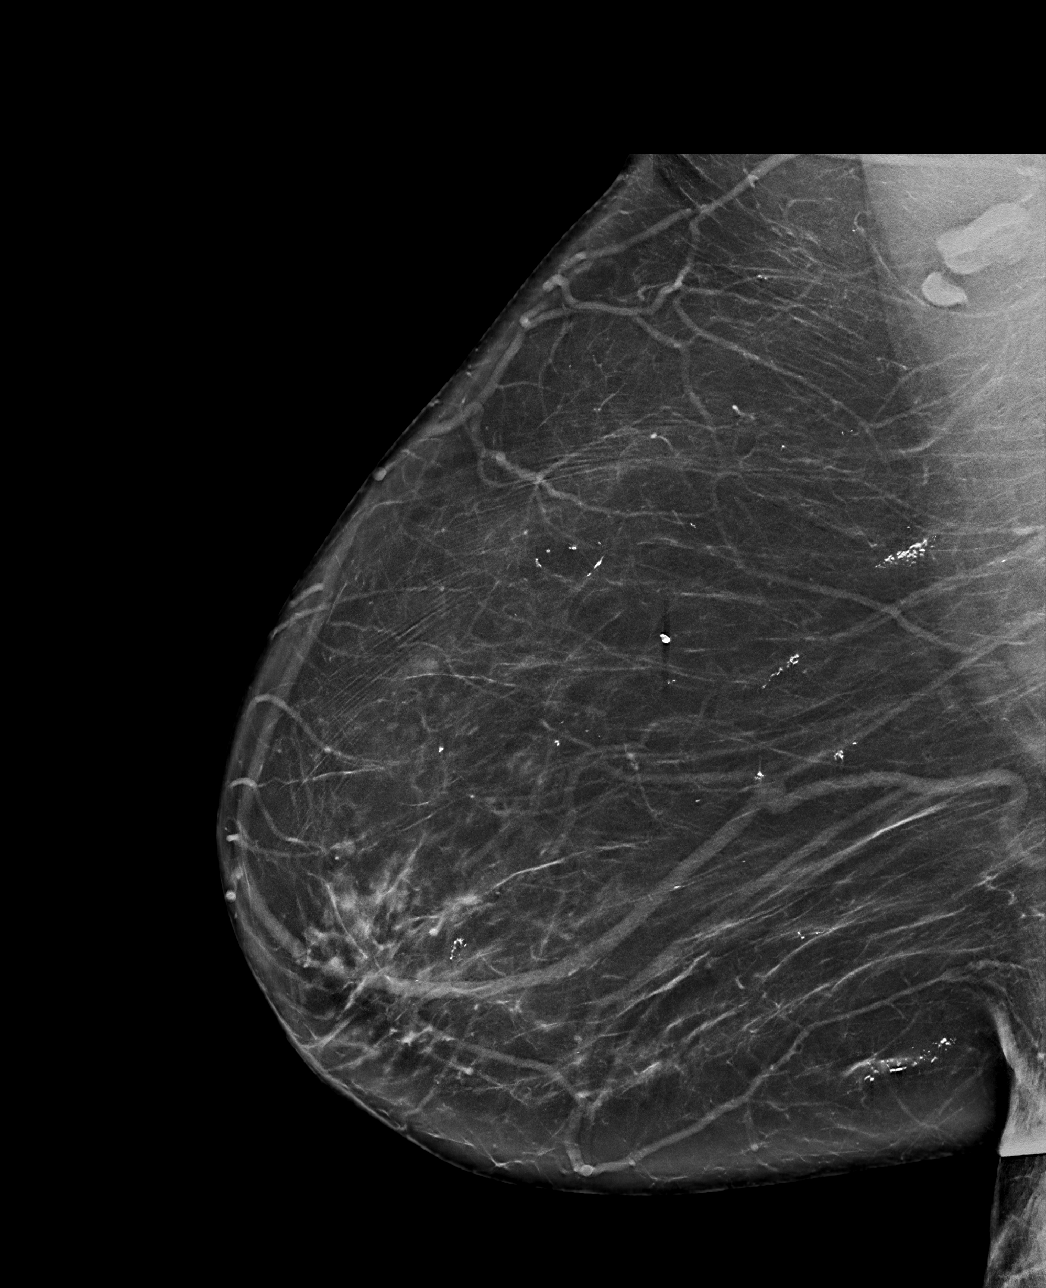

[R CC synth-2D]
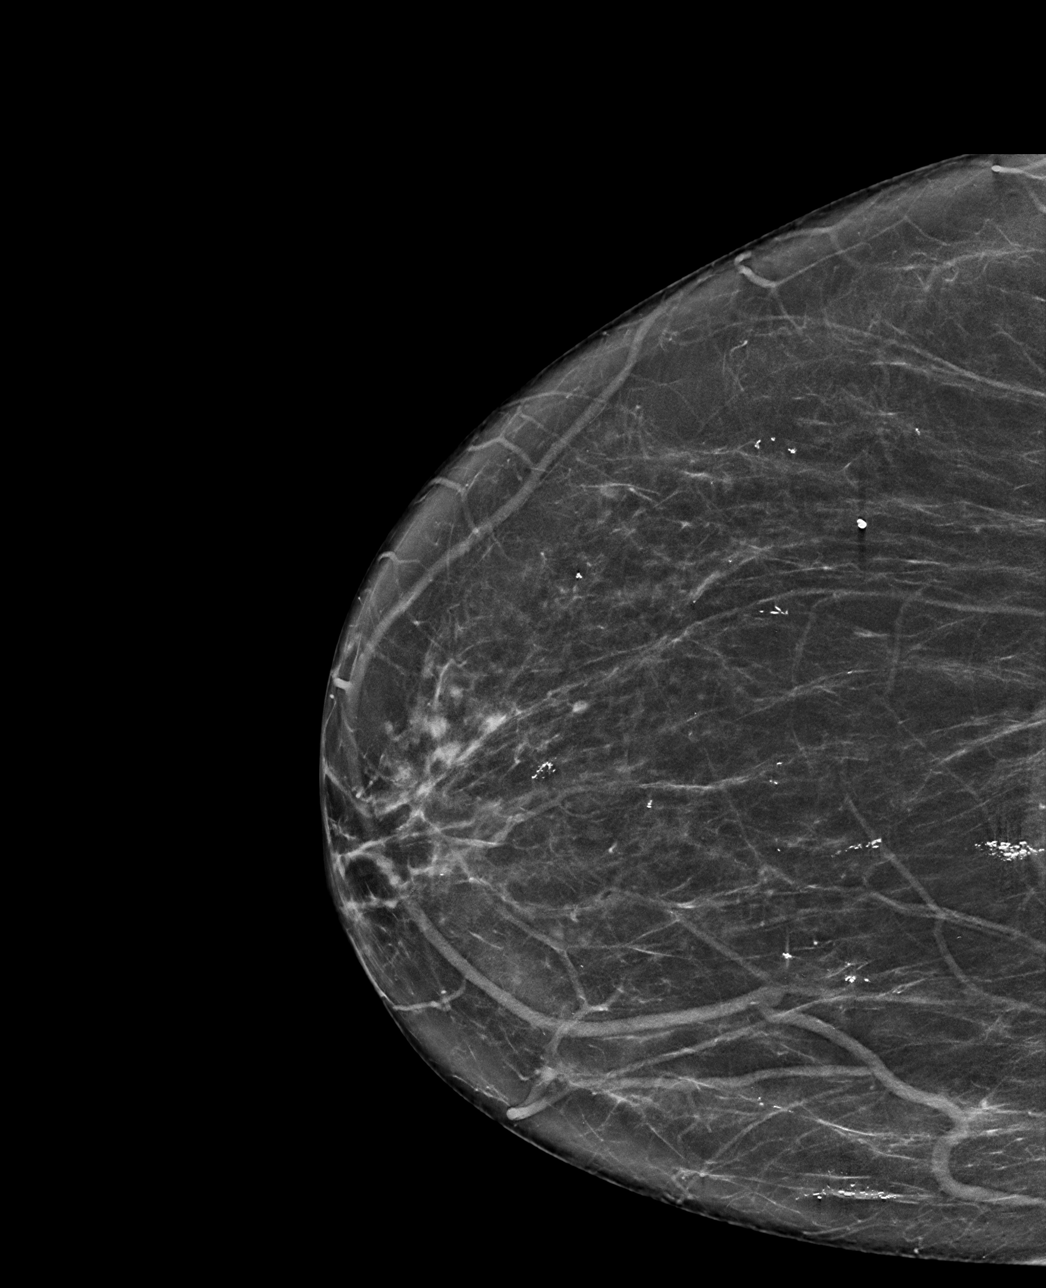

[R MLO tomo · tomo slice 45/88.0]
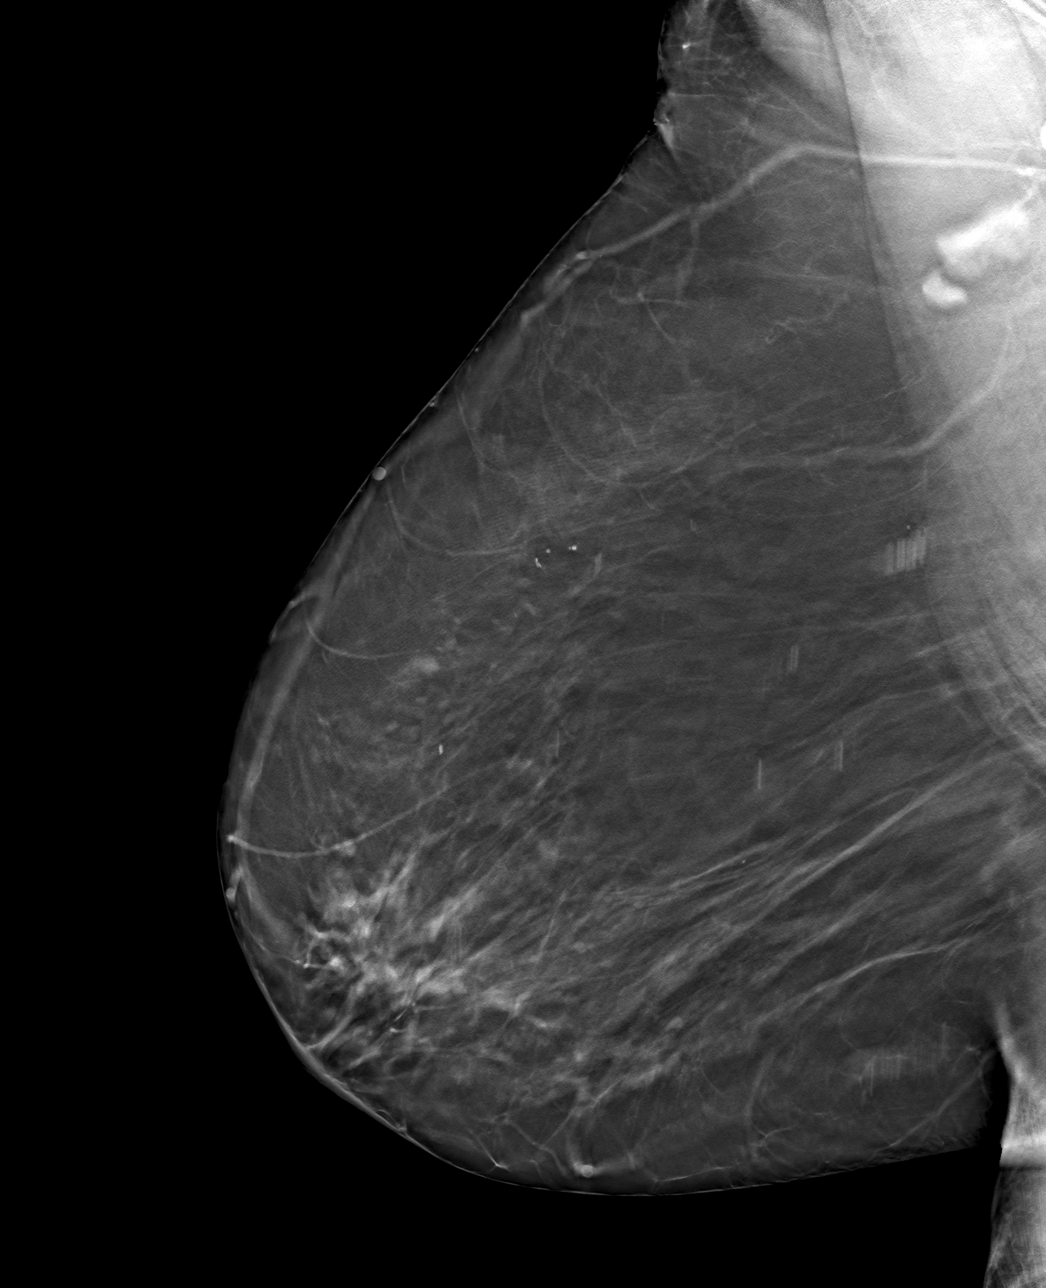

[R CC tomo · tomo slice 37/73.0]
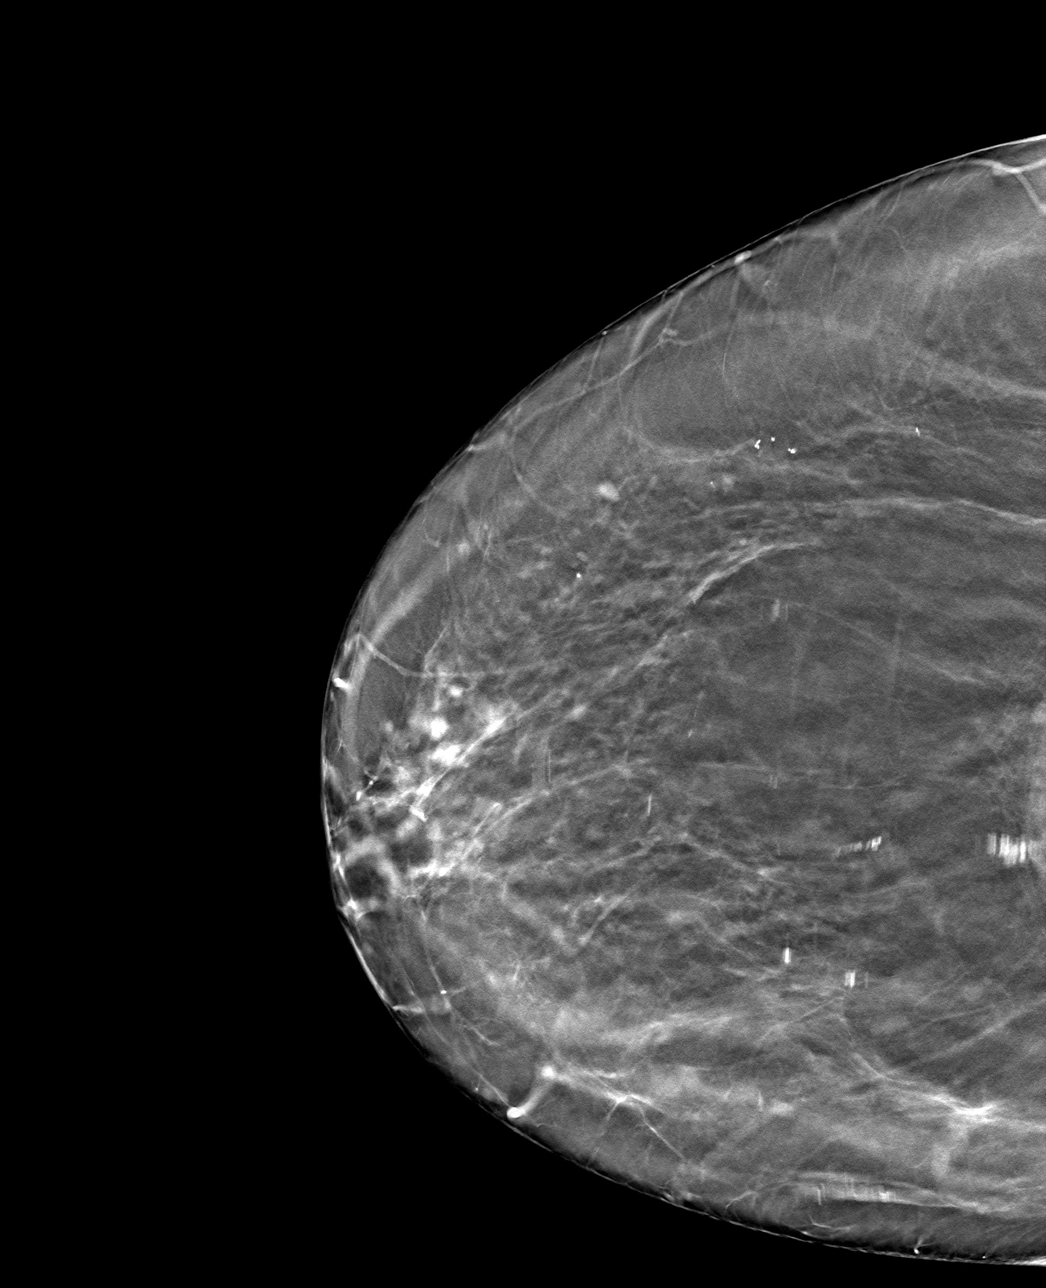

[4 of 12 positions shown; findings below may reference images not displayed]

ACR Breast Density Category b: There are scattered areas of
fibroglandular density.
FINDINGS: There are no new dominant masses, suspicious calcifications or
secondary signs of malignancy within the RIGHT breast.
IMPRESSION: No evidence of malignancy.

RECOMMENDATION:
1. Annual screening mammograms. Next bilateral screening mammogram
will be due in Sunday February, 2022.
2. Breast pain is a common condition which will often resolve on its
own without intervention. Benign causes of breast pain were
discussed with the patient. Breast pain can be improved by
over-the-counter pain medications such as Aspercreme, oral NSAIDs,
and topical NSAIDs. Studies have shown an improvement with use of
evening primrose oil and Vitamin E. The patient was instructed to
return for additional imaging and/or follow-up with primary care
physician if a palpable lump developed or if the pain became focal
and persistent.

I have discussed the findings and recommendations with the patient.
If applicable, a reminder letter will be sent to the patient
regarding the next appointment.

BI-RADS CATEGORY  1: Negative.

## 2022-09-28 DIAGNOSIS — M5459 Other low back pain: Secondary | ICD-10-CM | POA: Diagnosis not present

## 2022-09-28 DIAGNOSIS — M4805 Spinal stenosis, thoracolumbar region: Secondary | ICD-10-CM | POA: Diagnosis not present

## 2022-09-30 DIAGNOSIS — M4805 Spinal stenosis, thoracolumbar region: Secondary | ICD-10-CM | POA: Diagnosis not present

## 2022-09-30 DIAGNOSIS — M5459 Other low back pain: Secondary | ICD-10-CM | POA: Diagnosis not present

## 2022-10-04 ENCOUNTER — Encounter: Payer: Self-pay | Admitting: Cardiovascular Disease

## 2022-10-04 ENCOUNTER — Ambulatory Visit: Payer: HMO | Attending: Cardiovascular Disease | Admitting: Cardiovascular Disease

## 2022-10-04 VITALS — BP 128/72 | HR 81 | Ht 64.0 in | Wt 187.6 lb

## 2022-10-04 DIAGNOSIS — R072 Precordial pain: Secondary | ICD-10-CM

## 2022-10-04 NOTE — Patient Instructions (Signed)
Medication Instructions:  Your physician recommends that you continue on your current medications as directed. Please refer to the Current Medication list given to you today.  *If you need a refill on your cardiac medications before your next appointment, please call your pharmacy*   Lab Work: None ordered   If you have labs (blood work) drawn today and your tests are completely normal, you will receive your results only by: MyChart Message (if you have MyChart) OR A paper copy in the mail If you have any lab test that is abnormal or we need to change your treatment, we will call you to review the results.   Testing/Procedures: None ordered    Follow-Up: Follow up as needed   Other Instructions

## 2022-10-04 NOTE — Progress Notes (Signed)
Chief Complaint  Patient presents with   Follow-up    Chest pain   History of Present Illness: 77 yo female with history of GERD, HTN, IBS, sleep apnea and sickle cell trait here today for follow up. I saw her as a new consult on 08/19/22 for the evaluation of chest pain. She had right hip surgery in September 2023 and had been working with PT. At her first visit here she described upper central chest pain after several minutes of exercise with associated dyspnea. No diaphoresis or nausea. The pain lasted for a few minutes and resolved with rest. No palpitations or LE edema. Coronary CTA 08/28/22 with no evidence of CAD. Calcium score of 0. Echo 09/20/22 with LVEF=60-65%. No significant valve disease.   She is here today for follow up. The patient denies any palpitations, lower extremity edema, orthopnea, PND, dizziness, near syncope or syncope. Still having occasional chest pains.   Primary Care Physician: Alysia Penna, MD (Lorin Picket, NP)  Past Medical History:  Diagnosis Date   Anemia    in past   Arthritis    Cataracts, bilateral    Diarrhea    chronic   Ectopic pregnancy    GERD (gastroesophageal reflux disease)    Hypertension    IBS (irritable bowel syndrome)    Lactose intolerance    OSA on CPAP 2018   Osteoporosis    Plantar fasciitis    Sickle cell anemia (HCC)    has a trait   Sleep apnea    uses c-pap   Vitamin D deficiency     Past Surgical History:  Procedure Laterality Date   ABDOMINAL HYSTERECTOMY     BREAST BIOPSY Left 03/31/1997   benign   CHOLECYSTECTOMY  2008   COLONOSCOPY  2009   negative screening   TOTAL HIP ARTHROPLASTY Right 01/18/2022   Procedure: TOTAL HIP ARTHROPLASTY ANTERIOR APPROACH;  Surgeon: Durene Romans, MD;  Location: [facility];  Service: Orthopedics;  Laterality: Right;   TUBAL LIGATION      Current Outpatient Medications  Medication Sig Dispense Refill   acetaminophen (TYLENOL) 500 MG tablet Take 500 mg by mouth every 6 (six)  hours as needed.     amLODipine (NORVASC) 10 MG tablet Take 1 tablet (10 mg total) by mouth every evening. 90 tablet 3   ascorbic acid (VITAMIN C) 500 MG tablet Take 500 mg by mouth daily.     Calcium Carb-Cholecalciferol (CALCIUM 600/VITAMIN D3) 600-20 MG-MCG TABS Take 1 tablet by mouth daily.     Cholecalciferol (VITAMIN D-3 PO) Take 1,000 Units by mouth daily.     Cyanocobalamin (VITAMIN B-12 PO) Take 2,500 mcg by mouth daily.     docusate sodium (COLACE) 100 MG capsule Take 1 capsule (100 mg total) by mouth 2 (two) times daily. 10 capsule 0   HYDROcodone-acetaminophen (NORCO/VICODIN) 5-325 MG tablet Take 1 tablet by mouth every 4 (four) hours as needed for severe pain. 42 tablet 0   latanoprost (XALATAN) 0.005 % ophthalmic solution Place 1 drop into both eyes every evening. 5 mL 1   lisinopril (ZESTRIL) 40 MG tablet Take 1 tablet (40 mg total) by mouth daily. 90 tablet 0   loperamide (IMODIUM A-D) 2 MG tablet Take 2 mg by mouth 4 (four) times daily as needed for diarrhea or loose stools.     methocarbamol (ROBAXIN) 500 MG tablet Take 1 tablet (500 mg total) by mouth every 6 (six) hours as needed for muscles spasm or pain. 90 tablet  0   Omega-3 Fatty Acids (FISH OIL) 1000 MG CAPS Take 1,000 mg by mouth daily.     ondansetron (ZOFRAN) 4 MG tablet Take 1 tablet (4 mg total) by mouth every 6 (six) hours as needed for nausea. 20 tablet 0   oxybutynin (DITROPAN) 5 MG tablet Take 1 tablet (5 mg total) by mouth 3 (three) times daily. 270 tablet 1   pantoprazole (PROTONIX) 40 MG tablet Take 1 tablet (40 mg total) by mouth daily. 30 tablet 1   triamcinolone cream (KENALOG) 0.1 % Apply 1 Application topically 2 (two) times daily as needed (skin irritation).     vitamin E 200 UNIT capsule Take 200 Units by mouth daily.     No current facility-administered medications for this visit.    Allergies  Allergen Reactions   Ibuprofen     Pain in stomach/nausea   Penicillins     Pain in stomach Tolerated  Cephalosporin Date: 01/18/22     Nsaids     Doctor's order not to take nsaids due to problems with stomach ? Has IBS   Tramadol Other (See Comments)    Social History   Socioeconomic History   Marital status: Single    Spouse name: Not on file   Number of children: 2   Years of education: Not on file   Highest education level: Not on file  Occupational History   Occupation: retired   Occupation: Retired-Telephone operator/CNA  Tobacco Use   Smoking status: Former    Types: Cigarettes    Quit date: 05/31/1983    Years since quitting: 39.3   Smokeless tobacco: Never  Vaping Use   Vaping Use: Never used  Substance and Sexual Activity   Alcohol use: No   Drug use: No   Sexual activity: Not Currently  Other Topics Concern   Not on file  Social History Narrative   Caffeine: tea and hot chocolate    Social Determinants of Health   Financial Resource Strain: Not on file  Food Insecurity: Not on file  Transportation Needs: Not on file  Physical Activity: Not on file  Stress: Not on file  Social Connections: Not on file  Intimate Partner Violence: Not on file    Family History  Problem Relation Age of Onset   Heart attack Father    Stroke Father    Stroke Paternal Grandmother    Diabetes Son    Breast cancer Cousin 30   Sleep apnea Cousin     Review of Systems:  As stated in the HPI and otherwise negative.   BP 128/72   Pulse 81   Ht 5\' 4"  (1.626 m)   Wt 85.1 kg   SpO2 98%   BMI 32.20 kg/m   Physical Examination: General: Well developed, well nourished, NAD  HEENT: OP clear, mucus membranes moist  SKIN: warm, dry. No rashes. Neuro: No focal deficits  Musculoskeletal: Muscle strength 5/5 all ext  Psychiatric: Mood and affect normal  Neck: No JVD, no carotid bruits, no thyromegaly, no lymphadenopathy.  Lungs:Clear bilaterally, no wheezes, rhonci, crackles Cardiovascular: Regular rate and rhythm. No murmurs, gallops or rubs. Abdomen:Soft. Bowel sounds  present. Non-tender.  Extremities: No lower extremity edema. Pulses are 2 + in the bilateral DP/PT.  EKG:  EKG is not ordered today. The ekg ordered today demonstrates   Echo 09/20/22:  1. Left ventricular ejection fraction, by estimation, is 60 to 65%. The  left ventricle has normal function. The left ventricle has no regional  wall motion abnormalities. There is mild left ventricular hypertrophy.  Left ventricular diastolic parameters  are consistent with Grade I diastolic dysfunction (impaired relaxation).  The average left ventricular global longitudinal strain is -24.3 %. The  global longitudinal strain is normal.   2. Right ventricular systolic function is normal. The right ventricular  size is normal.   3. Left atrial size was mildly dilated.   4. The mitral valve is abnormal. Trivial mitral valve regurgitation. No  evidence of mitral stenosis.   5. The aortic valve is tricuspid. There is mild calcification of the  aortic valve. There is mild thickening of the aortic valve. Aortic valve  regurgitation is not visualized. Aortic valve sclerosis is present, with  no evidence of aortic valve stenosis.   6. The inferior vena cava is normal in size with greater than 50%  respiratory variability, suggesting right atrial pressure of 3 mmHg.   Cardiac CTA 08/28/22: Coronary Arteries:  Normal coronary origin.  Right dominance.   Coronary Calcium Score:   Left main: 0   Left anterior descending artery: 0   Left circumflex artery: 0   Right coronary artery: 0   Ramus intermedius artery: 0   Total: 0   Percentile: 1st for age, sex, and race matched control.   Plaque Analysis:   Left circumflex artery: 0 Cubic mm calcified plaque, 4 cubic mm non calcified plaque, 0 cubic mm low attenuation plaque, 4 cubic mm total plaque burden.   RCA is a large dominant artery that gives rise to PDA and PLA. There is no significant plaque.   Left main is a large artery that gives rise to  LAD, RI, and LCX arteries. There is no significant plaque.   LAD is a large vessel that gives rise to one large D1 Branch. There is no significant plaque.   LCX is a non-dominant artery that gives rise to one OM1 branch. There is no significant plaque.   There is a ramus intermedius vessel that is a small vessel.   Other findings:   Aorta: Normal size.  Aortic atherosclerosis.  No dissection.   Main Pulmonary Artery: Normal size of the pulmonary artery.   Systemic Veins: Normal drainage   Aortic Valve:  Tri-leaflet.  AV calcium score 8.   Mitral valve: No calcifications.   Normal pulmonary vein drainage into the left atrium.   Normal left atrial appendage without a thrombus.   Interatrial septum with no communications.   Left Ventricle: Normal size   Left Atrium: Normal size   Right Ventricle: Normal size   Right Atrium: Normal size   Pericardium: Normal thickness   Extra-cardiac findings: See attached radiology report for non-cardiac structures.   Artifact: Slab artifact.   IMPRESSION: 1. Coronary calcium score of 0. This was 1st percentile for age, sex, and race matched control.   2. Normal coronary origin with right dominance.   3. CAD-RADS 0. No evidence of CAD (0%). Consider non-atherosclerotic causes of chest pain.   4. Total plaque volume 4 cubic mm, with no calcified or low attenuation plaque. This is less than 10th percentile for age and gender matched control.  Recent Labs: 01/20/2022: Hemoglobin 11.1; Platelets 238 08/19/2022: BUN 10; Creatinine, Ser 0.68; Potassium 4.2; Sodium 144   Lipid Panel    Component Value Date/Time   CHOL 136 08/26/2009 2124   TRIG 161 (H) 08/26/2009 2124   HDL 54 08/26/2009 2124   CHOLHDL 2.5 Ratio 08/26/2009 2124   VLDL 32 08/26/2009 2124  LDLCALC 50 08/26/2009 2124     Wt Readings from Last 3 Encounters:  10/04/22 85.1 kg  08/29/22 83.9 kg  08/19/22 87.3 kg    Assessment and Plan:   1. Chest pain:  Her chest pain is not felt to be cardiac related. Coronary CTA with no evidence of CAD. Echo with normal LV function and no valve disease. No further cardiac workup is indicated.    Labs/ tests ordered today include:  No orders of the defined types were placed in this encounter.  Disposition:   F/U with me as needed.   Signed, Verne Carrow, MD, [facility] 10/04/2022 10:25 AM    [facility] [address] [place], [place], [place]  [address] Phone: [phone]; Fax: [phone]

## 2022-10-05 ENCOUNTER — Other Ambulatory Visit (HOSPITAL_COMMUNITY): Payer: Self-pay

## 2022-10-05 ENCOUNTER — Telehealth: Payer: Self-pay | Admitting: Internal Medicine

## 2022-10-05 ENCOUNTER — Encounter: Payer: Self-pay | Admitting: Internal Medicine

## 2022-10-05 ENCOUNTER — Other Ambulatory Visit: Payer: Self-pay

## 2022-10-05 MED ORDER — LISINOPRIL 40 MG PO TABS
40.0000 mg | ORAL_TABLET | Freq: Every evening | ORAL | 1 refills | Status: DC
  Filled 2022-10-05: qty 90, 90d supply, fill #0

## 2022-10-05 NOTE — Telephone Encounter (Signed)
Inbound call from patient, states she is having chest and back pain, states her pcp believes its gerd and she was prescribed protonix, however patient would like to discuss other ways of relief prior to her appointment scheduled for 7/9.

## 2022-10-05 NOTE — Telephone Encounter (Signed)
Pt stated that she is having reflux despite taking Protonix and requesting other ways to reduce the reflux. Pt was sent a a my chart with foods to eat with GERD. Pt made aware. Pt has previously scheduled office visit. Pt aware. Pt verbalized understanding with all questions answered.

## 2022-10-07 DIAGNOSIS — M5459 Other low back pain: Secondary | ICD-10-CM | POA: Diagnosis not present

## 2022-10-07 DIAGNOSIS — M4805 Spinal stenosis, thoracolumbar region: Secondary | ICD-10-CM | POA: Diagnosis not present

## 2022-10-12 ENCOUNTER — Other Ambulatory Visit (HOSPITAL_COMMUNITY): Payer: Self-pay

## 2022-10-14 DIAGNOSIS — M5459 Other low back pain: Secondary | ICD-10-CM | POA: Diagnosis not present

## 2022-10-14 DIAGNOSIS — M4805 Spinal stenosis, thoracolumbar region: Secondary | ICD-10-CM | POA: Diagnosis not present

## 2022-10-15 DIAGNOSIS — G4733 Obstructive sleep apnea (adult) (pediatric): Secondary | ICD-10-CM | POA: Diagnosis not present

## 2022-10-21 DIAGNOSIS — M4805 Spinal stenosis, thoracolumbar region: Secondary | ICD-10-CM | POA: Diagnosis not present

## 2022-10-21 DIAGNOSIS — M5459 Other low back pain: Secondary | ICD-10-CM | POA: Diagnosis not present

## 2022-10-28 ENCOUNTER — Other Ambulatory Visit (HOSPITAL_COMMUNITY): Payer: Self-pay

## 2022-11-03 ENCOUNTER — Other Ambulatory Visit: Payer: Self-pay

## 2022-11-03 ENCOUNTER — Other Ambulatory Visit (HOSPITAL_COMMUNITY): Payer: Self-pay

## 2022-11-03 MED ORDER — PANTOPRAZOLE SODIUM 40 MG PO TBEC
40.0000 mg | DELAYED_RELEASE_TABLET | Freq: Every day | ORAL | 1 refills | Status: DC
  Filled 2022-11-03: qty 90, 90d supply, fill #0
  Filled 2023-02-13: qty 90, 90d supply, fill #1

## 2022-11-04 DIAGNOSIS — M4805 Spinal stenosis, thoracolumbar region: Secondary | ICD-10-CM | POA: Diagnosis not present

## 2022-11-04 DIAGNOSIS — M5459 Other low back pain: Secondary | ICD-10-CM | POA: Diagnosis not present

## 2022-11-11 DIAGNOSIS — M5459 Other low back pain: Secondary | ICD-10-CM | POA: Diagnosis not present

## 2022-11-11 DIAGNOSIS — M4805 Spinal stenosis, thoracolumbar region: Secondary | ICD-10-CM | POA: Diagnosis not present

## 2022-11-15 DIAGNOSIS — G4733 Obstructive sleep apnea (adult) (pediatric): Secondary | ICD-10-CM | POA: Diagnosis not present

## 2022-11-21 ENCOUNTER — Other Ambulatory Visit (HOSPITAL_COMMUNITY): Payer: Self-pay

## 2022-11-21 MED ORDER — LATANOPROST 0.005 % OP SOLN
1.0000 [drp] | Freq: Every evening | OPHTHALMIC | 5 refills | Status: AC
  Filled 2022-11-21: qty 5, 50d supply, fill #0
  Filled 2023-01-16: qty 5, 50d supply, fill #1
  Filled 2023-03-02: qty 5, 50d supply, fill #2
  Filled 2023-04-21: qty 5, 50d supply, fill #3
  Filled 2023-09-08: qty 5, 50d supply, fill #4

## 2022-11-22 ENCOUNTER — Other Ambulatory Visit: Payer: Self-pay

## 2022-11-25 ENCOUNTER — Other Ambulatory Visit (HOSPITAL_COMMUNITY): Payer: Self-pay

## 2022-12-05 DIAGNOSIS — I10 Essential (primary) hypertension: Secondary | ICD-10-CM | POA: Diagnosis not present

## 2022-12-05 DIAGNOSIS — R7989 Other specified abnormal findings of blood chemistry: Secondary | ICD-10-CM | POA: Diagnosis not present

## 2022-12-05 DIAGNOSIS — E559 Vitamin D deficiency, unspecified: Secondary | ICD-10-CM | POA: Diagnosis not present

## 2022-12-05 DIAGNOSIS — M810 Age-related osteoporosis without current pathological fracture: Secondary | ICD-10-CM | POA: Diagnosis not present

## 2022-12-06 ENCOUNTER — Encounter: Payer: Self-pay | Admitting: Internal Medicine

## 2022-12-06 ENCOUNTER — Ambulatory Visit (INDEPENDENT_AMBULATORY_CARE_PROVIDER_SITE_OTHER): Payer: HMO | Admitting: Internal Medicine

## 2022-12-06 VITALS — BP 122/80 | HR 76 | Ht 64.0 in | Wt 185.0 lb

## 2022-12-06 DIAGNOSIS — K580 Irritable bowel syndrome with diarrhea: Secondary | ICD-10-CM | POA: Diagnosis not present

## 2022-12-06 DIAGNOSIS — R079 Chest pain, unspecified: Secondary | ICD-10-CM | POA: Diagnosis not present

## 2022-12-06 DIAGNOSIS — R7309 Other abnormal glucose: Secondary | ICD-10-CM | POA: Diagnosis not present

## 2022-12-06 NOTE — Patient Instructions (Signed)
You have been scheduled for a Barium Esophogram at [facility] on 12/14/2022 at 1:00pm. Please arrive 30 minutes prior to your appointment for registration. Make certain not to have anything to eat or drink 3 hours prior to your test. If you need to reschedule for any reason, please contact radiology at [phone] to do so. __________________________________________________________________ A barium swallow is an examination that concentrates on views of the esophagus. This tends to be a double contrast exam (barium and two liquids which, when combined, create a gas to distend the wall of the oesophagus) or single contrast (non-ionic iodine based). The study is usually tailored to your symptoms so a good history is essential. Attention is paid during the study to the form, structure and configuration of the esophagus, looking for functional disorders (such as aspiration, dysphagia, achalasia, motility and reflux) EXAMINATION You may be asked to change into a gown, depending on the type of swallow being performed. A radiologist and radiographer will perform the procedure. The radiologist will advise you of the type of contrast selected for your procedure and direct you during the exam. You will be asked to stand, sit or lie in several different positions and to hold a small amount of fluid in your mouth before being asked to swallow while the imaging is performed .In some instances you may be asked to swallow barium coated marshmallows to assess the motility of a solid food bolus. The exam can be recorded as a digital or video fluoroscopy procedure. POST PROCEDURE It will take 1-2 days for the barium to pass through your system. To facilitate this, it is important, unless otherwise directed, to increase your fluids for the next 24-48hrs and to resume your normal diet.  This test typically takes about 30 minutes to  perform. __________________________________________________________________________________  I appreciate the opportunity to care for you. Stan Head, MD, [facility]

## 2022-12-06 NOTE — Progress Notes (Addendum)
Laura Robles 76 y.o. 1945/09/30 [id number]  Assessment & Plan:   Encounter Diagnoses  Name Primary?   Chest pain, mostly exertional, negative cardiac evaluation so far Yes   Irritable bowel syndrome with diarrhea     Does not sound like GERD to me.  I will get a barium swallow question dysmotility.  I wonder about the possibility of microvascular angina.  May need to discuss with Dr. Clifton James.  ADDENDUM - I DID THIS - HE SAID MICROVASCULAR ANGINA POSSIBLE BUT IS TREATED W/ NORVASC AND/OR IMDUR AND SHE IS ALREADY ON THAT SO TO ME SOUNDS LESS LIKELY    I looked more closely at her labs after she left, she had a hemoglobin of 11 last year after her hip surgery and I do not think she has had one since.  We may need to recheck that.  Although she does have sickle trait and that could explain it.  It seems possible that she had labs with Dr. Link Snuffer yesterday but they are not yet recorded I did look in care everywhere.  If she is significantly anemic that may be the root of this issue, I think deconditioning could be as well.  She said she was not really exerting herself much until she started physical therapy within the past few months.  She does continue to try to use [employer] over the [employer] and do exercise bike. Ba swallow ? Microvascular angina  BA SWALLOW W/ DYSMOTILITY  WILL CHECK HGB  Subjective:   Chief Complaint: Chest pain  HPI 77 year old African-American woman known to me from prior negative screening colonoscopy procedures (last 2019) and IBS-D who started having chest pain earlier this year.  She had hip replacement last year then she had back pain and went to physical therapy.  She started noting exertional chest and back discomfort, upper back while riding the exercise bike.  It can also occur while walking.  It occurs at other times but more frequently with exertion.  There is no classic heartburn indigestion or regurgitation and no dysphagia.  She still has  her IBS-D and treats that with Imodium effectively.  She tried ginger ale when she had episodes of this chest tightness but it did not help.  She has been placed on PPI and Pepcid and there is route really been any change.  She describes resting and then the symptoms dissipate they seem to last for minutes.  Another example where she parked far away from the [facility] and walked in and coming in from the parking lot she got this tightness and then had the rest.  It does not sound like there is associated dyspnea to any great degree though it may occur.  The pain does not seem to be related to movement bending twisting etc. and she does not complain of tenderness.  She saw cardiology and Dr. Clifton James had her evaluated and this is summarized below:  Saw cardiology 10/04/22 and 08/19/2022 notes reviewed Coronary CTA 08/28/22 with no evidence of CAD. Calcium score of 0. Echo 09/20/22 with LVEF=60-65%. No significant valve disease.  1. Chest pain: Her chest pain is not felt to be cardiac related. Coronary CTA with no evidence of CAD. Echo with normal LV function and no valve disease. No further cardiac workup is indicated       Latest Ref Rng & Units 01/20/2022    3:45 AM 01/19/2022    3:23 AM 01/06/2022    1:38 PM  CBC  WBC 4.0 -  10.5 K/uL 10.6  9.9  5.1   Hemoglobin 12.0 - 15.0 g/dL 45.4  09.8  11.9   Hematocrit 36.0 - 46.0 % 33.4  34.5  39.1   Platelets 150 - 400 K/uL 238  242  276   \ Allergies  Allergen Reactions   Ibuprofen     Pain in stomach/nausea   Penicillins     Pain in stomach Tolerated Cephalosporin Date: 01/18/22     Nsaids     Doctor's order not to take nsaids due to problems with stomach ? Has IBS   Tramadol Other (See Comments)   Current Meds  Medication Sig   amLODipine (NORVASC) 10 MG tablet Take 1 tablet (10 mg total) by mouth every evening.   ascorbic acid (VITAMIN C) 500 MG tablet Take 500 mg by mouth daily.   Calcium Carb-Cholecalciferol (CALCIUM 600/VITAMIN  D3) 600-20 MG-MCG TABS Take 1 tablet by mouth daily.   Cholecalciferol (VITAMIN D-3 PO) Take 1,000 Units by mouth daily.   Cyanocobalamin (VITAMIN B-12 PO) Take 2,500 mcg by mouth daily.   famotidine (PEPCID) 40 MG/5ML suspension Take by mouth daily.   latanoprost (XALATAN) 0.005 % ophthalmic solution Place 1 drop into both eyes every evening.   lisinopril (ZESTRIL) 40 MG tablet Take 1 tablet (40 mg total) by mouth daily.   loperamide (IMODIUM A-D) 2 MG tablet Take 2 mg by mouth 4 (four) times daily as needed for diarrhea or loose stools.   methocarbamol (ROBAXIN) 500 MG tablet Take 1 tablet (500 mg total) by mouth every 6 (six) hours as needed for muscles spasm or pain.   Omega-3 Fatty Acids (FISH OIL) 1000 MG CAPS Take 1,000 mg by mouth daily.   oxybutynin (DITROPAN) 5 MG tablet Take 1 tablet (5 mg total) by mouth 3 (three) times daily.   pantoprazole (PROTONIX) 40 MG tablet Take 1 tablet (40 mg total) by mouth daily.   Past Medical History:  Diagnosis Date   Anemia    in past   Arthritis    Cataracts, bilateral    Colon polyps    Diarrhea    chronic   Ectopic pregnancy    GERD (gastroesophageal reflux disease)    Hypertension    IBS (irritable bowel syndrome)    Lactose intolerance    OSA on CPAP 2018   Osteoporosis    Plantar fasciitis    Sickle cell trait (HCC)    Sleep apnea    uses c-pap   Vitamin D deficiency    Past Surgical History:  Procedure Laterality Date   ABDOMINAL HYSTERECTOMY     BREAST BIOPSY Left 03/31/1997   benign   CHOLECYSTECTOMY  2008   COLONOSCOPY  2009   negative screening   TOTAL HIP ARTHROPLASTY Right 01/18/2022   Procedure: TOTAL HIP ARTHROPLASTY ANTERIOR APPROACH;  Surgeon: Durene Romans, MD;  Location: [facility];  Service: Orthopedics;  Laterality: Right;   TUBAL LIGATION     Social History   Social History Narrative   Caffeine: tea and hot chocolate    family history includes Breast cancer (age of onset: 5) in her cousin; Diabetes in  her son; Heart attack in her father; Sleep apnea in her cousin; Stroke in her father and paternal grandmother.   Review of Systems 1 leg shortening and the other half she does have hip pain still back pain is improved.  She has decreased hearing urinary and fecal incontinence at times and arthritis complaints.  Other review of systems is negative.  Objective:   Physical Exam @BP  122/80   Pulse 76   Ht 5\' 4"  (1.626 m)   Wt 185 lb (83.9 kg)   BMI 31.76 kg/m @  General:  Obese elderly bw in no acute distress Eyes:  anicteric. Neck:   supple w/o thyromegaly or mass.  Lungs: Clear to auscultation bilaterally. Chest wall is nontender and no pain w/shoulder mvt Heart:   S1S2, no rubs, murmurs, gallops. Abdomen:  soft, non-tender, no hepatosplenomegaly, hernia, or mass and BS+.   Lymph:  no cervical or supraclavicular adenopathy. Extremities:   no edema, cyanosis or clubbing Neuro:  A&O x 3.  Psych:  appropriate mood and  Affect.   Data Reviewed: See HPI

## 2022-12-12 DIAGNOSIS — R82998 Other abnormal findings in urine: Secondary | ICD-10-CM | POA: Diagnosis not present

## 2022-12-12 DIAGNOSIS — R079 Chest pain, unspecified: Secondary | ICD-10-CM | POA: Diagnosis not present

## 2022-12-12 DIAGNOSIS — E559 Vitamin D deficiency, unspecified: Secondary | ICD-10-CM | POA: Diagnosis not present

## 2022-12-12 DIAGNOSIS — Z1331 Encounter for screening for depression: Secondary | ICD-10-CM | POA: Diagnosis not present

## 2022-12-12 DIAGNOSIS — Z0000 Encounter for general adult medical examination without abnormal findings: Secondary | ICD-10-CM | POA: Diagnosis not present

## 2022-12-12 DIAGNOSIS — I10 Essential (primary) hypertension: Secondary | ICD-10-CM | POA: Diagnosis not present

## 2022-12-12 DIAGNOSIS — K219 Gastro-esophageal reflux disease without esophagitis: Secondary | ICD-10-CM | POA: Diagnosis not present

## 2022-12-12 DIAGNOSIS — Z1339 Encounter for screening examination for other mental health and behavioral disorders: Secondary | ICD-10-CM | POA: Diagnosis not present

## 2022-12-12 DIAGNOSIS — M810 Age-related osteoporosis without current pathological fracture: Secondary | ICD-10-CM | POA: Diagnosis not present

## 2022-12-12 DIAGNOSIS — Z96641 Presence of right artificial hip joint: Secondary | ICD-10-CM | POA: Diagnosis not present

## 2022-12-12 DIAGNOSIS — M1990 Unspecified osteoarthritis, unspecified site: Secondary | ICD-10-CM | POA: Diagnosis not present

## 2022-12-12 DIAGNOSIS — Z23 Encounter for immunization: Secondary | ICD-10-CM | POA: Diagnosis not present

## 2022-12-12 DIAGNOSIS — M1611 Unilateral primary osteoarthritis, right hip: Secondary | ICD-10-CM | POA: Diagnosis not present

## 2022-12-14 ENCOUNTER — Ambulatory Visit (HOSPITAL_COMMUNITY)
Admission: RE | Admit: 2022-12-14 | Discharge: 2022-12-14 | Disposition: A | Discharge status: Home / Self Care | Payer: HMO | Source: Ambulatory Visit | Attending: Internal Medicine | Admitting: Internal Medicine

## 2022-12-14 DIAGNOSIS — R079 Chest pain, unspecified: Secondary | ICD-10-CM | POA: Insufficient documentation

## 2022-12-14 DIAGNOSIS — K224 Dyskinesia of esophagus: Secondary | ICD-10-CM | POA: Diagnosis not present

## 2022-12-15 DIAGNOSIS — G4733 Obstructive sleep apnea (adult) (pediatric): Secondary | ICD-10-CM | POA: Diagnosis not present

## 2022-12-22 ENCOUNTER — Telehealth: Payer: Self-pay | Admitting: Internal Medicine

## 2022-12-22 DIAGNOSIS — R079 Chest pain, unspecified: Secondary | ICD-10-CM

## 2022-12-22 NOTE — Telephone Encounter (Addendum)
Please tell patient I have communicated w/ cardiology Dr. Clifton James and it seems unlikely her sxs are from heart  Next thing I need her to do is come for a CBC  It has been ordered  CORRECTION - cbc cancelled - I overlooked records   Hgb is NL 13.9 on 12/06/22  I cancelled CBC

## 2022-12-22 NOTE — Telephone Encounter (Signed)
Am communicating again w/ Dr. Clifton James about other possible treatment and will get back  RN does not need tocall

## 2022-12-23 ENCOUNTER — Other Ambulatory Visit (HOSPITAL_COMMUNITY): Payer: Self-pay

## 2022-12-23 NOTE — Telephone Encounter (Signed)
Called patient and let her know that Dr. Leone Payor had been in contact with cardiology & that they would be reaching out to her soon to discuss new medication. Pt verbalized all understanding.

## 2022-12-23 NOTE — Telephone Encounter (Signed)
Let patient know that cardiology is tryng a medication called Imdur to see if that helps her chest pain.  She should have or will be hearing frm Dr. Gibson Ramp RN

## 2022-12-27 ENCOUNTER — Telehealth: Payer: Self-pay | Admitting: *Deleted

## 2022-12-27 ENCOUNTER — Other Ambulatory Visit: Payer: Self-pay

## 2022-12-27 ENCOUNTER — Other Ambulatory Visit (HOSPITAL_COMMUNITY): Payer: Self-pay

## 2022-12-27 MED ORDER — ISOSORBIDE MONONITRATE ER 30 MG PO TB24
30.0000 mg | ORAL_TABLET | Freq: Every day | ORAL | 3 refills | Status: DC
  Filled 2022-12-27: qty 90, 90d supply, fill #0
  Filled 2023-03-23: qty 90, 90d supply, fill #1
  Filled 2023-06-23: qty 90, 90d supply, fill #2
  Filled 2023-10-24: qty 90, 90d supply, fill #3

## 2022-12-27 NOTE — Telephone Encounter (Signed)
Spoke w patient and reviewed the recommendation to start IMDUR 30 mg daily.  Reviewed possibility of headache and adv okay to take tylenol if needed.  She is going to take at bedtime daily.

## 2022-12-27 NOTE — Telephone Encounter (Signed)
-----   Message from Stan Head sent at 12/23/2022  1:32 PM EDT ----- Regarding: RE: ? microvascular angina Thanks! ----- Message ----- From: Kathleene Hazel, MD Sent: 12/22/2022   9:44 AM EDT To: Iva Boop, MD; Lendon Ka, RN Subject: RE: ? microvascular angina                     We can do that.  Abb Gobert, Would you mind calling her and telling her we would like to add Imdur 30 mg per day to see if this helps her chest pain and review side effect of headache the first few days?  Thanks, chris ----- Message ----- From: Iva Boop, MD Sent: 12/22/2022   9:30 AM EDT To: Kathleene Hazel, MD Subject: FW: ? microvascular angina                     Update - correction  I thought she might be anemic but isn't and does not soundlike GERD to me   So after reviewing things again perhaps should add Imdur ----- Message ----- From: Iva Boop, MD Sent: 12/22/2022   8:22 AM EDT To: Kathleene Hazel, MD Subject: RE: ? microvascular angina                     Sounds like its not microvascular angina - thanks  I have got a few things to try  She may come back.  Baldo Ash ----- Message ----- From: Kathleene Hazel, MD Sent: 12/18/2022   7:58 AM EDT To: Iva Boop, MD Subject: RE: ? microvascular angina                     Gwen Pounds, She has no evidence of disease in her epicardial vessels and normal LV function but there is always the possibility of microvascular angina. We can do testing for that with flow studies during cath but ultimately we if we prove it we put them on Norvasc or Imdur. She is already on Norvasc. We could consider adding Imdur. Thayer Ohm ----- Message ----- From: Iva Boop, MD Sent: 12/15/2022   4:57 PM EDT To: Kathleene Hazel, MD Subject: ? microvascular angina                         Thayer Ohm - this man was seen by you and me recently. A ba swallow does show some dysmotility but he has a pretty strong exertional  chest pain hx though your w/u is neg  Could it ne microvascular angina?  Any other testing?  Thanks  Baldo Ash

## 2022-12-27 NOTE — Telephone Encounter (Signed)
Left message for patient to call back  

## 2022-12-28 ENCOUNTER — Other Ambulatory Visit: Payer: Self-pay

## 2023-01-15 DIAGNOSIS — G4733 Obstructive sleep apnea (adult) (pediatric): Secondary | ICD-10-CM | POA: Diagnosis not present

## 2023-01-16 ENCOUNTER — Telehealth: Payer: Self-pay | Admitting: Internal Medicine

## 2023-01-16 ENCOUNTER — Other Ambulatory Visit (HOSPITAL_COMMUNITY): Payer: Self-pay

## 2023-01-16 NOTE — Telephone Encounter (Signed)
Inbound call from patient requesting to talk about hepatitis C screening. Requesting to know what the screening consists of. Please advise, thank you.

## 2023-01-16 NOTE — Telephone Encounter (Signed)
Pt stated that she made an appointment this Morning but was unsure if she needed to keep this Appointment. Pt stated she seen somewhere in her chart that stated Hepatitis  C and she thought that she needed to make an appointment. Pt was confused. Pt chart was reviewed and noted that there was no documentation for a need for an appointment. Pt made aware. Appointment was canceled. Pt made aware.  Pt verbalized understanding with all questions answered.

## 2023-01-23 ENCOUNTER — Other Ambulatory Visit (HOSPITAL_COMMUNITY): Payer: Self-pay

## 2023-01-23 ENCOUNTER — Ambulatory Visit: Payer: HMO | Admitting: Nurse Practitioner

## 2023-02-04 ENCOUNTER — Other Ambulatory Visit (HOSPITAL_COMMUNITY): Payer: Self-pay

## 2023-02-06 ENCOUNTER — Other Ambulatory Visit: Payer: Self-pay

## 2023-02-06 ENCOUNTER — Other Ambulatory Visit (HOSPITAL_COMMUNITY): Payer: Self-pay

## 2023-02-09 ENCOUNTER — Other Ambulatory Visit: Payer: Self-pay

## 2023-02-13 ENCOUNTER — Other Ambulatory Visit (HOSPITAL_COMMUNITY): Payer: Self-pay

## 2023-02-13 MED ORDER — LISINOPRIL 40 MG PO TABS
40.0000 mg | ORAL_TABLET | Freq: Every evening | ORAL | 3 refills | Status: DC
  Filled 2023-02-13: qty 90, 90d supply, fill #0
  Filled 2023-05-10: qty 90, 90d supply, fill #1
  Filled 2023-08-08 – 2023-08-09 (×3): qty 90, 90d supply, fill #2
  Filled 2023-11-06: qty 90, 90d supply, fill #3

## 2023-02-15 DIAGNOSIS — G4733 Obstructive sleep apnea (adult) (pediatric): Secondary | ICD-10-CM | POA: Diagnosis not present

## 2023-02-20 ENCOUNTER — Other Ambulatory Visit: Payer: Self-pay

## 2023-02-22 ENCOUNTER — Other Ambulatory Visit: Payer: Self-pay

## 2023-02-22 ENCOUNTER — Other Ambulatory Visit (HOSPITAL_COMMUNITY): Payer: Self-pay

## 2023-02-22 DIAGNOSIS — M24651 Ankylosis, right hip: Secondary | ICD-10-CM | POA: Diagnosis not present

## 2023-02-22 DIAGNOSIS — Z96641 Presence of right artificial hip joint: Secondary | ICD-10-CM | POA: Diagnosis not present

## 2023-02-22 DIAGNOSIS — M1611 Unilateral primary osteoarthritis, right hip: Secondary | ICD-10-CM | POA: Diagnosis not present

## 2023-02-22 MED ORDER — PREDNISONE 5 MG (48) PO TBPK
5.0000 mg | ORAL_TABLET | ORAL | 0 refills | Status: AC
  Filled 2023-02-22 – 2023-02-28 (×2): qty 48, 12d supply, fill #0

## 2023-02-23 ENCOUNTER — Other Ambulatory Visit: Payer: Self-pay

## 2023-02-24 ENCOUNTER — Other Ambulatory Visit: Payer: Self-pay

## 2023-02-28 ENCOUNTER — Other Ambulatory Visit (HOSPITAL_COMMUNITY): Payer: Self-pay

## 2023-02-28 ENCOUNTER — Other Ambulatory Visit: Payer: Self-pay

## 2023-03-01 ENCOUNTER — Other Ambulatory Visit (HOSPITAL_COMMUNITY): Payer: Self-pay

## 2023-03-02 ENCOUNTER — Other Ambulatory Visit: Payer: Self-pay

## 2023-03-03 DIAGNOSIS — G4733 Obstructive sleep apnea (adult) (pediatric): Secondary | ICD-10-CM | POA: Diagnosis not present

## 2023-03-06 ENCOUNTER — Other Ambulatory Visit (HOSPITAL_COMMUNITY): Payer: Self-pay

## 2023-03-06 ENCOUNTER — Other Ambulatory Visit: Payer: Self-pay | Admitting: Internal Medicine

## 2023-03-06 DIAGNOSIS — Z1231 Encounter for screening mammogram for malignant neoplasm of breast: Secondary | ICD-10-CM

## 2023-03-07 DIAGNOSIS — L814 Other melanin hyperpigmentation: Secondary | ICD-10-CM | POA: Diagnosis not present

## 2023-03-07 DIAGNOSIS — L853 Xerosis cutis: Secondary | ICD-10-CM | POA: Diagnosis not present

## 2023-03-07 DIAGNOSIS — L731 Pseudofolliculitis barbae: Secondary | ICD-10-CM | POA: Diagnosis not present

## 2023-03-08 DIAGNOSIS — Z471 Aftercare following joint replacement surgery: Secondary | ICD-10-CM | POA: Diagnosis not present

## 2023-03-08 DIAGNOSIS — M25551 Pain in right hip: Secondary | ICD-10-CM | POA: Diagnosis not present

## 2023-03-09 DIAGNOSIS — Z96641 Presence of right artificial hip joint: Secondary | ICD-10-CM | POA: Diagnosis not present

## 2023-03-09 DIAGNOSIS — M25551 Pain in right hip: Secondary | ICD-10-CM | POA: Diagnosis not present

## 2023-03-09 DIAGNOSIS — I10 Essential (primary) hypertension: Secondary | ICD-10-CM | POA: Diagnosis not present

## 2023-03-09 DIAGNOSIS — Z0189 Encounter for other specified special examinations: Secondary | ICD-10-CM | POA: Diagnosis not present

## 2023-03-09 DIAGNOSIS — R252 Cramp and spasm: Secondary | ICD-10-CM | POA: Diagnosis not present

## 2023-03-09 DIAGNOSIS — Z0000 Encounter for general adult medical examination without abnormal findings: Secondary | ICD-10-CM | POA: Diagnosis not present

## 2023-03-15 ENCOUNTER — Other Ambulatory Visit (HOSPITAL_COMMUNITY): Payer: Self-pay

## 2023-03-15 DIAGNOSIS — Z471 Aftercare following joint replacement surgery: Secondary | ICD-10-CM | POA: Diagnosis not present

## 2023-03-15 DIAGNOSIS — M25551 Pain in right hip: Secondary | ICD-10-CM | POA: Diagnosis not present

## 2023-03-17 DIAGNOSIS — G4733 Obstructive sleep apnea (adult) (pediatric): Secondary | ICD-10-CM | POA: Diagnosis not present

## 2023-03-22 DIAGNOSIS — M25551 Pain in right hip: Secondary | ICD-10-CM | POA: Diagnosis not present

## 2023-03-22 DIAGNOSIS — Z471 Aftercare following joint replacement surgery: Secondary | ICD-10-CM | POA: Diagnosis not present

## 2023-03-23 ENCOUNTER — Other Ambulatory Visit (HOSPITAL_COMMUNITY): Payer: Self-pay

## 2023-03-28 ENCOUNTER — Other Ambulatory Visit (HOSPITAL_COMMUNITY): Payer: Self-pay

## 2023-03-28 DIAGNOSIS — L239 Allergic contact dermatitis, unspecified cause: Secondary | ICD-10-CM | POA: Diagnosis not present

## 2023-03-28 MED ORDER — TRIAMCINOLONE ACETONIDE 0.1 % EX CREA
TOPICAL_CREAM | CUTANEOUS | 0 refills | Status: AC
  Filled 2023-03-28: qty 30, 15d supply, fill #0

## 2023-03-29 DIAGNOSIS — M25551 Pain in right hip: Secondary | ICD-10-CM | POA: Diagnosis not present

## 2023-03-29 DIAGNOSIS — Z471 Aftercare following joint replacement surgery: Secondary | ICD-10-CM | POA: Diagnosis not present

## 2023-04-05 DIAGNOSIS — Z471 Aftercare following joint replacement surgery: Secondary | ICD-10-CM | POA: Diagnosis not present

## 2023-04-05 DIAGNOSIS — M25551 Pain in right hip: Secondary | ICD-10-CM | POA: Diagnosis not present

## 2023-04-07 ENCOUNTER — Ambulatory Visit
Admission: RE | Admit: 2023-04-07 | Discharge: 2023-04-07 | Disposition: A | Discharge status: Home / Self Care | Payer: HMO | Source: Ambulatory Visit | Attending: Internal Medicine | Admitting: Internal Medicine

## 2023-04-07 DIAGNOSIS — Z1231 Encounter for screening mammogram for malignant neoplasm of breast: Secondary | ICD-10-CM

## 2023-04-11 ENCOUNTER — Other Ambulatory Visit (HOSPITAL_COMMUNITY): Payer: Self-pay

## 2023-04-11 ENCOUNTER — Other Ambulatory Visit: Payer: Self-pay

## 2023-04-11 DIAGNOSIS — L239 Allergic contact dermatitis, unspecified cause: Secondary | ICD-10-CM | POA: Diagnosis not present

## 2023-04-11 MED ORDER — PREDNISONE 20 MG PO TABS
ORAL_TABLET | ORAL | 0 refills | Status: AC
  Filled 2023-04-11 (×2): qty 30, 15d supply, fill #0

## 2023-04-12 ENCOUNTER — Other Ambulatory Visit (HOSPITAL_COMMUNITY): Payer: Self-pay

## 2023-04-12 ENCOUNTER — Other Ambulatory Visit: Payer: Self-pay

## 2023-04-17 DIAGNOSIS — G4733 Obstructive sleep apnea (adult) (pediatric): Secondary | ICD-10-CM | POA: Diagnosis not present

## 2023-04-19 DIAGNOSIS — Z471 Aftercare following joint replacement surgery: Secondary | ICD-10-CM | POA: Diagnosis not present

## 2023-04-19 DIAGNOSIS — M25551 Pain in right hip: Secondary | ICD-10-CM | POA: Diagnosis not present

## 2023-04-21 ENCOUNTER — Other Ambulatory Visit (HOSPITAL_COMMUNITY): Payer: Self-pay

## 2023-04-25 DIAGNOSIS — M25551 Pain in right hip: Secondary | ICD-10-CM | POA: Diagnosis not present

## 2023-04-25 DIAGNOSIS — Z471 Aftercare following joint replacement surgery: Secondary | ICD-10-CM | POA: Diagnosis not present

## 2023-05-03 ENCOUNTER — Other Ambulatory Visit (HOSPITAL_COMMUNITY): Payer: Self-pay

## 2023-05-03 DIAGNOSIS — Z0000 Encounter for general adult medical examination without abnormal findings: Secondary | ICD-10-CM | POA: Diagnosis not present

## 2023-05-03 DIAGNOSIS — R252 Cramp and spasm: Secondary | ICD-10-CM | POA: Diagnosis not present

## 2023-05-03 DIAGNOSIS — G629 Polyneuropathy, unspecified: Secondary | ICD-10-CM | POA: Diagnosis not present

## 2023-05-03 DIAGNOSIS — B029 Zoster without complications: Secondary | ICD-10-CM | POA: Diagnosis not present

## 2023-05-03 DIAGNOSIS — Z1389 Encounter for screening for other disorder: Secondary | ICD-10-CM | POA: Diagnosis not present

## 2023-05-04 ENCOUNTER — Other Ambulatory Visit (HOSPITAL_COMMUNITY): Payer: Self-pay

## 2023-05-04 ENCOUNTER — Other Ambulatory Visit: Payer: Self-pay

## 2023-05-04 MED ORDER — PREGABALIN 50 MG PO CAPS
50.0000 mg | ORAL_CAPSULE | Freq: Three times a day (TID) | ORAL | 1 refills | Status: DC
  Filled 2023-05-04: qty 90, 30d supply, fill #0
  Filled 2023-06-26: qty 90, 30d supply, fill #1

## 2023-05-04 MED ORDER — MUPIROCIN 2 % EX OINT
TOPICAL_OINTMENT | CUTANEOUS | 0 refills | Status: AC
  Filled 2023-05-04: qty 22, 10d supply, fill #0

## 2023-05-04 MED ORDER — VALACYCLOVIR HCL 1 G PO TABS
1000.0000 mg | ORAL_TABLET | Freq: Three times a day (TID) | ORAL | 0 refills | Status: AC
  Filled 2023-05-04: qty 21, 7d supply, fill #0

## 2023-05-05 ENCOUNTER — Other Ambulatory Visit (HOSPITAL_COMMUNITY): Payer: Self-pay

## 2023-05-05 ENCOUNTER — Other Ambulatory Visit: Payer: Self-pay

## 2023-05-08 ENCOUNTER — Other Ambulatory Visit (HOSPITAL_COMMUNITY): Payer: Self-pay

## 2023-05-08 ENCOUNTER — Other Ambulatory Visit: Payer: Self-pay

## 2023-05-08 MED ORDER — PANTOPRAZOLE SODIUM 40 MG PO TBEC
40.0000 mg | DELAYED_RELEASE_TABLET | Freq: Every day | ORAL | 3 refills | Status: AC
  Filled 2023-05-08: qty 90, 90d supply, fill #0
  Filled 2023-08-08 – 2023-08-09 (×3): qty 90, 90d supply, fill #1
  Filled 2023-11-06: qty 90, 90d supply, fill #2
  Filled 2024-02-26 – 2024-04-15 (×5): qty 90, 90d supply, fill #3

## 2023-05-09 ENCOUNTER — Other Ambulatory Visit (HOSPITAL_COMMUNITY): Payer: Self-pay

## 2023-05-09 MED ORDER — PANTOPRAZOLE SODIUM 40 MG PO TBEC
40.0000 mg | DELAYED_RELEASE_TABLET | Freq: Every day | ORAL | 3 refills | Status: DC
  Filled 2023-06-23 – 2024-02-07 (×2): qty 90, 90d supply, fill #0
  Filled 2024-04-15 – 2024-05-05 (×2): qty 90, 90d supply, fill #1

## 2023-05-10 ENCOUNTER — Other Ambulatory Visit: Payer: Self-pay

## 2023-05-10 DIAGNOSIS — Z471 Aftercare following joint replacement surgery: Secondary | ICD-10-CM | POA: Diagnosis not present

## 2023-05-10 DIAGNOSIS — M25551 Pain in right hip: Secondary | ICD-10-CM | POA: Diagnosis not present

## 2023-05-17 DIAGNOSIS — G4733 Obstructive sleep apnea (adult) (pediatric): Secondary | ICD-10-CM | POA: Diagnosis not present

## 2023-05-17 DIAGNOSIS — M25551 Pain in right hip: Secondary | ICD-10-CM | POA: Diagnosis not present

## 2023-05-17 DIAGNOSIS — Z471 Aftercare following joint replacement surgery: Secondary | ICD-10-CM | POA: Diagnosis not present

## 2023-05-22 ENCOUNTER — Other Ambulatory Visit (HOSPITAL_COMMUNITY): Payer: Self-pay

## 2023-05-22 DIAGNOSIS — H401131 Primary open-angle glaucoma, bilateral, mild stage: Secondary | ICD-10-CM | POA: Diagnosis not present

## 2023-05-22 MED ORDER — LATANOPROST 0.005 % OP SOLN
1.0000 [drp] | Freq: Every evening | OPHTHALMIC | 3 refills | Status: DC
  Filled 2023-05-22: qty 7.5, 75d supply, fill #0
  Filled 2023-06-15: qty 7.5, 90d supply, fill #0
  Filled 2023-10-24: qty 7.5, 90d supply, fill #1
  Filled 2024-01-08: qty 7.5, 75d supply, fill #2
  Filled 2024-02-26 – 2024-03-06 (×2): qty 7.5, 75d supply, fill #3

## 2023-05-22 MED ORDER — METHOCARBAMOL 500 MG PO TABS
500.0000 mg | ORAL_TABLET | Freq: Four times a day (QID) | ORAL | 2 refills | Status: AC | PRN
  Filled 2023-05-22: qty 90, 23d supply, fill #0

## 2023-05-23 DIAGNOSIS — Z471 Aftercare following joint replacement surgery: Secondary | ICD-10-CM | POA: Diagnosis not present

## 2023-05-23 DIAGNOSIS — M25551 Pain in right hip: Secondary | ICD-10-CM | POA: Diagnosis not present

## 2023-05-29 DIAGNOSIS — H6123 Impacted cerumen, bilateral: Secondary | ICD-10-CM | POA: Diagnosis not present

## 2023-05-29 DIAGNOSIS — R600 Localized edema: Secondary | ICD-10-CM | POA: Diagnosis not present

## 2023-05-29 DIAGNOSIS — H9011 Conductive hearing loss, unilateral, right ear, with unrestricted hearing on the contralateral side: Secondary | ICD-10-CM | POA: Diagnosis not present

## 2023-05-29 DIAGNOSIS — I872 Venous insufficiency (chronic) (peripheral): Secondary | ICD-10-CM | POA: Diagnosis not present

## 2023-05-29 DIAGNOSIS — I10 Essential (primary) hypertension: Secondary | ICD-10-CM | POA: Diagnosis not present

## 2023-06-13 DIAGNOSIS — Z471 Aftercare following joint replacement surgery: Secondary | ICD-10-CM | POA: Diagnosis not present

## 2023-06-13 DIAGNOSIS — M25551 Pain in right hip: Secondary | ICD-10-CM | POA: Diagnosis not present

## 2023-06-14 DIAGNOSIS — G4733 Obstructive sleep apnea (adult) (pediatric): Secondary | ICD-10-CM | POA: Diagnosis not present

## 2023-06-15 ENCOUNTER — Other Ambulatory Visit: Payer: Self-pay

## 2023-06-15 ENCOUNTER — Other Ambulatory Visit (HOSPITAL_COMMUNITY): Payer: Self-pay

## 2023-06-17 DIAGNOSIS — G4733 Obstructive sleep apnea (adult) (pediatric): Secondary | ICD-10-CM | POA: Diagnosis not present

## 2023-06-21 DIAGNOSIS — Z471 Aftercare following joint replacement surgery: Secondary | ICD-10-CM | POA: Diagnosis not present

## 2023-06-21 DIAGNOSIS — M25551 Pain in right hip: Secondary | ICD-10-CM | POA: Diagnosis not present

## 2023-06-23 ENCOUNTER — Other Ambulatory Visit: Payer: Self-pay

## 2023-06-23 ENCOUNTER — Other Ambulatory Visit (HOSPITAL_COMMUNITY): Payer: Self-pay

## 2023-06-26 ENCOUNTER — Other Ambulatory Visit (HOSPITAL_COMMUNITY): Payer: Self-pay

## 2023-06-26 ENCOUNTER — Other Ambulatory Visit: Payer: Self-pay

## 2023-06-26 DIAGNOSIS — R300 Dysuria: Secondary | ICD-10-CM | POA: Diagnosis not present

## 2023-06-26 DIAGNOSIS — I10 Essential (primary) hypertension: Secondary | ICD-10-CM | POA: Diagnosis not present

## 2023-06-26 DIAGNOSIS — N3001 Acute cystitis with hematuria: Secondary | ICD-10-CM | POA: Diagnosis not present

## 2023-06-26 DIAGNOSIS — N390 Urinary tract infection, site not specified: Secondary | ICD-10-CM | POA: Diagnosis not present

## 2023-06-26 DIAGNOSIS — R600 Localized edema: Secondary | ICD-10-CM | POA: Diagnosis not present

## 2023-06-26 DIAGNOSIS — I872 Venous insufficiency (chronic) (peripheral): Secondary | ICD-10-CM | POA: Diagnosis not present

## 2023-06-26 DIAGNOSIS — G4733 Obstructive sleep apnea (adult) (pediatric): Secondary | ICD-10-CM | POA: Diagnosis not present

## 2023-06-26 MED ORDER — NITROFURANTOIN MONOHYD MACRO 100 MG PO CAPS
100.0000 mg | ORAL_CAPSULE | Freq: Two times a day (BID) | ORAL | 0 refills | Status: AC
  Filled 2023-06-26: qty 10, 5d supply, fill #0

## 2023-06-27 ENCOUNTER — Other Ambulatory Visit (HOSPITAL_COMMUNITY): Payer: Self-pay

## 2023-06-28 DIAGNOSIS — M25551 Pain in right hip: Secondary | ICD-10-CM | POA: Diagnosis not present

## 2023-06-28 DIAGNOSIS — Z471 Aftercare following joint replacement surgery: Secondary | ICD-10-CM | POA: Diagnosis not present

## 2023-07-03 ENCOUNTER — Other Ambulatory Visit: Payer: Self-pay

## 2023-07-03 ENCOUNTER — Other Ambulatory Visit (HOSPITAL_COMMUNITY): Payer: Self-pay

## 2023-07-03 MED ORDER — SULFAMETHOXAZOLE-TRIMETHOPRIM 800-160 MG PO TABS
1.0000 | ORAL_TABLET | Freq: Two times a day (BID) | ORAL | 0 refills | Status: AC
  Filled 2023-07-03: qty 14, 7d supply, fill #0

## 2023-07-20 ENCOUNTER — Other Ambulatory Visit (HOSPITAL_COMMUNITY): Payer: Self-pay

## 2023-07-20 ENCOUNTER — Other Ambulatory Visit (HOSPITAL_BASED_OUTPATIENT_CLINIC_OR_DEPARTMENT_OTHER): Payer: Self-pay

## 2023-07-20 ENCOUNTER — Other Ambulatory Visit: Payer: Self-pay

## 2023-07-20 MED ORDER — OXYBUTYNIN CHLORIDE 5 MG PO TABS
5.0000 mg | ORAL_TABLET | Freq: Two times a day (BID) | ORAL | 3 refills | Status: AC
  Filled 2023-07-20 – 2024-07-02 (×2): qty 180, 90d supply, fill #0

## 2023-07-20 MED ORDER — OXYBUTYNIN CHLORIDE 5 MG PO TABS
5.0000 mg | ORAL_TABLET | Freq: Two times a day (BID) | ORAL | 3 refills | Status: AC
  Filled 2023-07-20: qty 180, 90d supply, fill #0
  Filled 2023-10-13: qty 180, 90d supply, fill #1

## 2023-07-21 ENCOUNTER — Other Ambulatory Visit (HOSPITAL_COMMUNITY): Payer: Self-pay

## 2023-07-21 MED ORDER — OXYBUTYNIN CHLORIDE 5 MG PO TABS
5.0000 mg | ORAL_TABLET | Freq: Two times a day (BID) | ORAL | 3 refills | Status: AC
  Filled 2023-07-21 – 2023-11-06 (×2): qty 180, 180d supply, fill #0
  Filled 2024-02-07: qty 180, 90d supply, fill #0
  Filled 2024-05-02: qty 180, 90d supply, fill #1

## 2023-07-28 ENCOUNTER — Other Ambulatory Visit (HOSPITAL_COMMUNITY): Payer: Self-pay

## 2023-08-01 ENCOUNTER — Other Ambulatory Visit (HOSPITAL_COMMUNITY): Payer: Self-pay

## 2023-08-01 ENCOUNTER — Other Ambulatory Visit: Payer: Self-pay

## 2023-08-01 DIAGNOSIS — G4733 Obstructive sleep apnea (adult) (pediatric): Secondary | ICD-10-CM | POA: Diagnosis not present

## 2023-08-01 DIAGNOSIS — I872 Venous insufficiency (chronic) (peripheral): Secondary | ICD-10-CM | POA: Diagnosis not present

## 2023-08-01 DIAGNOSIS — I10 Essential (primary) hypertension: Secondary | ICD-10-CM | POA: Diagnosis not present

## 2023-08-01 DIAGNOSIS — R300 Dysuria: Secondary | ICD-10-CM | POA: Diagnosis not present

## 2023-08-01 DIAGNOSIS — R600 Localized edema: Secondary | ICD-10-CM | POA: Diagnosis not present

## 2023-08-01 DIAGNOSIS — N3001 Acute cystitis with hematuria: Secondary | ICD-10-CM | POA: Diagnosis not present

## 2023-08-01 MED ORDER — TRIAMCINOLONE ACETONIDE 0.1 % EX CREA
TOPICAL_CREAM | Freq: Three times a day (TID) | CUTANEOUS | 1 refills | Status: DC
  Filled 2023-08-01: qty 30, 10d supply, fill #0
  Filled 2023-10-13: qty 30, 10d supply, fill #1

## 2023-08-08 ENCOUNTER — Other Ambulatory Visit (HOSPITAL_COMMUNITY): Payer: Self-pay

## 2023-08-09 ENCOUNTER — Other Ambulatory Visit: Payer: Self-pay

## 2023-08-15 ENCOUNTER — Other Ambulatory Visit (HOSPITAL_COMMUNITY): Payer: Self-pay

## 2023-08-25 ENCOUNTER — Other Ambulatory Visit: Payer: Self-pay

## 2023-08-25 ENCOUNTER — Other Ambulatory Visit (HOSPITAL_COMMUNITY): Payer: Self-pay

## 2023-08-25 MED ORDER — PREGABALIN 50 MG PO CAPS
50.0000 mg | ORAL_CAPSULE | Freq: Three times a day (TID) | ORAL | 1 refills | Status: DC
Start: 2023-08-25 — End: 2024-01-09
  Filled 2023-08-25 – 2023-08-28 (×2): qty 90, 30d supply, fill #0
  Filled 2023-11-29: qty 90, 30d supply, fill #1

## 2023-08-25 MED ORDER — AMLODIPINE BESYLATE 10 MG PO TABS
10.0000 mg | ORAL_TABLET | Freq: Every evening | ORAL | 3 refills | Status: AC
  Filled 2023-08-25 – 2023-09-20 (×2): qty 90, 90d supply, fill #0
  Filled 2023-12-19: qty 90, 90d supply, fill #1
  Filled 2024-03-18: qty 90, 90d supply, fill #2
  Filled 2024-07-02: qty 90, 90d supply, fill #3

## 2023-08-28 ENCOUNTER — Other Ambulatory Visit: Payer: Self-pay

## 2023-08-28 ENCOUNTER — Other Ambulatory Visit (HOSPITAL_COMMUNITY): Payer: Self-pay

## 2023-08-28 ENCOUNTER — Encounter (HOSPITAL_COMMUNITY): Payer: Self-pay

## 2023-08-29 ENCOUNTER — Other Ambulatory Visit (HOSPITAL_COMMUNITY): Payer: Self-pay

## 2023-09-08 ENCOUNTER — Other Ambulatory Visit (HOSPITAL_COMMUNITY): Payer: Self-pay

## 2023-09-13 ENCOUNTER — Other Ambulatory Visit (HOSPITAL_COMMUNITY): Payer: Self-pay

## 2023-09-20 ENCOUNTER — Other Ambulatory Visit: Payer: Self-pay

## 2023-09-20 ENCOUNTER — Other Ambulatory Visit (HOSPITAL_COMMUNITY): Payer: Self-pay

## 2023-09-26 ENCOUNTER — Ambulatory Visit: Payer: HMO | Admitting: Adult Health

## 2023-10-02 NOTE — Progress Notes (Unsigned)
 Laura Robles

## 2023-10-03 ENCOUNTER — Encounter: Payer: Self-pay | Admitting: Adult Health

## 2023-10-03 ENCOUNTER — Ambulatory Visit: Payer: HMO | Admitting: Adult Health

## 2023-10-03 VITALS — BP 130/76 | HR 69 | Ht 65.0 in | Wt 194.8 lb

## 2023-10-03 DIAGNOSIS — G4733 Obstructive sleep apnea (adult) (pediatric): Secondary | ICD-10-CM

## 2023-10-03 NOTE — Patient Instructions (Signed)
 Continue using CPAP nightly and greater than 4 hours each night If your symptoms worsen or you develop new symptoms please let us know.

## 2023-10-03 NOTE — Progress Notes (Signed)
 PATIENT: Laura Robles DOB: Nov 14, 1945  REASON FOR VISIT: follow up HISTORY FROM: patient PRIMARY NEUROLOGIST: Dr. Omar Bibber  Chief Complaint  Patient presents with   Room 20    Pt is here Alone. Pt states that things have been going pretty good with her CPAP Machine. Pt states she has to slap the water  chamber back in her machine in order to use it and would like to discuss it.      HISTORY OF PRESENT ILLNESS: Today 10/03/23:  Laura Robles is a 78 y.o. female with a history of OSA on CPAP. Returns today for follow-up.  Reports that CPAP has been working well.  Continues to find it beneficial.  She is having a hard time getting her water  chamber back in the machine.  Reports that her machine is only a year old.  She has not taken it to her DME company.  Her download is below      REVIEW OF SYSTEMS: Out of a complete 14 system review of symptoms, the patient complains only of the following symptoms, and all other reviewed systems are negative.  FSS 31  ESS 8  ALLERGIES: Allergies  Allergen Reactions   Ibuprofen     Pain in stomach/nausea   Penicillins     Pain in stomach Tolerated Cephalosporin Date: 01/18/22     Nsaids     Doctor's order not to take nsaids due to problems with stomach ? Has IBS   Tramadol Other (See Comments)    HOME MEDICATIONS: Outpatient Medications Prior to Visit  Medication Sig Dispense Refill   amLODipine  (NORVASC ) 10 MG tablet Take 1 tablet (10 mg total) by mouth every evening. 90 tablet 3   ascorbic acid (VITAMIN C) 500 MG tablet Take 500 mg by mouth daily.     Calcium Carb-Cholecalciferol (CALCIUM 600/VITAMIN D3) 600-20 MG-MCG TABS Take 1 tablet by mouth daily.     Cholecalciferol (VITAMIN D-3 PO) Take 1,000 Units by mouth daily.     Cyanocobalamin (VITAMIN B-12 PO) Take 2,500 mcg by mouth daily.     famotidine (PEPCID) 40 MG/5ML suspension Take by mouth daily.     isosorbide  mononitrate (IMDUR ) 30 MG 24 hr tablet Take 1 tablet  (30 mg total) by mouth daily. 90 tablet 3   latanoprost  (XALATAN ) 0.005 % ophthalmic solution Place 1 drop into both eyes every evening. 7.5 mL 3   lisinopril  (ZESTRIL ) 40 MG tablet Take 1 tablet (40 mg total) by mouth every evening. 90 tablet 3   loperamide (IMODIUM A-D) 2 MG tablet Take 2 mg by mouth 4 (four) times daily as needed for diarrhea or loose stools.     methocarbamol  (ROBAXIN ) 500 MG tablet Take 1 tablet (500 mg total) by mouth every 6 (six) hours as needed for muscle spasms or pain. 90 tablet 2   Omega-3 Fatty Acids (FISH OIL) 1000 MG CAPS Take 1,000 mg by mouth daily.     oxybutynin  (DITROPAN ) 5 MG tablet Take 1 tablet (5 mg total) by mouth daily. 180 tablet 3   pantoprazole  (PROTONIX ) 40 MG tablet Take 1 tablet (40 mg total) by mouth daily. 90 tablet 3   pregabalin  (LYRICA ) 50 MG capsule Take 1 capsule (50 mg total) by mouth 3 (three) times daily for feet and hand cramps. 90 capsule 1   triamcinolone  cream (KENALOG ) 0.1 % Apply topically to affected area 3 (three) times daily as directed. 30 g 1   latanoprost  (XALATAN ) 0.005 % ophthalmic solution Place 1  drop into both eyes every evening. (Patient not taking: Reported on 10/03/2023) 5 mL 5   lisinopril  (ZESTRIL ) 40 MG tablet Take 1 tablet (40 mg total) by mouth daily. (Patient not taking: Reported on 10/03/2023) 90 tablet 0   methocarbamol  (ROBAXIN ) 500 MG tablet Take 1 tablet (500 mg total) by mouth every 6 (six) hours as needed for muscles spasm or pain. (Patient not taking: Reported on 10/03/2023) 90 tablet 0   mupirocin  ointment (BACTROBAN ) 2 % Apply to the affected area of skin twice daily for 10 days as directed (Patient not taking: Reported on 10/03/2023) 22 g 0   nitrofurantoin , macrocrystal-monohydrate, (MACROBID ) 100 MG capsule Take 1 capsule (100 mg total) by mouth every 12 (twelve) hours with food for 5 days (Patient not taking: Reported on 10/03/2023) 10 capsule 0   oxybutynin  (DITROPAN ) 5 MG tablet Take 1 tablet (5 mg total) by  mouth 2 (two) times daily. (Patient not taking: Reported on 10/03/2023) 180 tablet 3   oxybutynin  (DITROPAN ) 5 MG tablet Take 1 tablet (5 mg total) by mouth 2 (two) times daily. (Patient not taking: Reported on 10/03/2023) 180 tablet 3   pantoprazole  (PROTONIX ) 40 MG tablet Take 1 tablet (40 mg total) by mouth daily. (Patient not taking: Reported on 10/03/2023) 90 tablet 3   predniSONE  (DELTASONE ) 20 MG tablet Take 3 tabs by mouth daily for 5 days, then take 2 tabs by mouth daily for 5 days, then take 1 tab by mouth daily for 5 days. (Patient not taking: Reported on 10/03/2023) 30 tablet 0   predniSONE  (STERAPRED UNI-PAK 48 TAB) 5 MG (48) TBPK tablet Take 1 tablet (5 mg total) by mouth as directed on package. (Patient not taking: Reported on 10/03/2023) 48 tablet 0   triamcinolone  cream (KENALOG ) 0.1 % Apply to the affected areas twice daily for 2 weeks. Not safe for long term use (Patient not taking: Reported on 10/03/2023) 30 g 0   valACYclovir  (VALTREX ) 1000 MG tablet Take 1 tablet (1,000 mg total) by mouth 3 (three) times daily for 7 days (Patient not taking: Reported on 10/03/2023) 21 tablet 0   No facility-administered medications prior to visit.    PAST MEDICAL HISTORY: Past Medical History:  Diagnosis Date   Anemia    in past   Arthritis    Cataracts, bilateral    Colon polyps    Diarrhea    chronic   Ectopic pregnancy    GERD (gastroesophageal reflux disease)    Hypertension    IBS (irritable bowel syndrome)    Lactose intolerance    OSA on CPAP 2018   Osteoporosis    Plantar fasciitis    Sickle cell trait (HCC)    Sleep apnea    uses c-pap   Vitamin D deficiency     PAST SURGICAL HISTORY: Past Surgical History:  Procedure Laterality Date   ABDOMINAL HYSTERECTOMY     BREAST BIOPSY Left 03/31/1997   benign   CHOLECYSTECTOMY  2008   COLONOSCOPY  2009   negative screening   TOTAL HIP ARTHROPLASTY Right 01/18/2022   Procedure: TOTAL HIP ARTHROPLASTY ANTERIOR APPROACH;  Surgeon:  Claiborne Crew, MD;  Location: [facility];  Service: Orthopedics;  Laterality: Right;   TUBAL LIGATION      FAMILY HISTORY: Family History  Problem Relation Age of Onset   Heart attack Father    Stroke Father    Stroke Paternal Grandmother    Diabetes Son    Breast cancer Cousin 30   Sleep apnea  Cousin    Colon cancer Neg Hx    Esophageal cancer Neg Hx    Stomach cancer Neg Hx     SOCIAL HISTORY: Social History   Socioeconomic History   Marital status: Single    Spouse name: Not on file   Number of children: 2   Years of education: Not on file   Highest education level: Not on file  Occupational History   Occupation: retired   Occupation: Retired-Telephone operator/CNA   Occupation: retired  Tobacco Use   Smoking status: Former    Current packs/day: 0.00    Types: Cigarettes    Quit date: 05/31/1983    Years since quitting: 40.3   Smokeless tobacco: Never  Vaping Use   Vaping status: Never Used  Substance and Sexual Activity   Alcohol  use: No   Drug use: No   Sexual activity: Not Currently  Other Topics Concern   Not on file  Social History Narrative   Caffeine: tea and hot chocolate    Social Drivers of Corporate investment banker Strain: Not on file  Food Insecurity: Not on file  Transportation Needs: Not on file  Physical Activity: Not on file  Stress: Not on file  Social Connections: Not on file  Intimate Partner Violence: Not on file      PHYSICAL EXAM  Vitals:   10/03/23 1003  BP: 130/76  Pulse: 69  Weight: 194 lb 12.8 oz (88.4 kg)  Height: 5\' 5"  (1.651 m)   Body mass index is 32.42 kg/m.  Generalized: Well developed, in no acute distress  Chest: Lungs clear to auscultation bilaterally  Neurological examination  Mentation: Alert oriented to time, place, history taking. Follows all commands speech and language fluent Cranial nerve II-XII: Facial symmetry noted  DIAGNOSTIC DATA (LABS, IMAGING, TESTING) - I reviewed patient records, labs,  notes, testing and imaging myself where available.  Lab Results  Component Value Date   WBC 10.6 (H) 01/20/2022   HGB 11.1 (L) 01/20/2022   HCT 33.4 (L) 01/20/2022   MCV 82.7 01/20/2022   PLT 238 01/20/2022      Component Value Date/Time   NA 144 08/19/2022 1357   K 4.2 08/19/2022 1357   CL 108 (H) 08/19/2022 1357   CO2 23 08/19/2022 1357   GLUCOSE 90 08/19/2022 1357   GLUCOSE 121 (H) 01/19/2022 0323   BUN 10 08/19/2022 1357   CREATININE 0.68 08/19/2022 1357   CALCIUM 9.2 08/19/2022 1357   PROT 7.2 08/06/2007 2138   ALBUMIN 4.2 08/06/2007 2138   AST 18 08/06/2007 2138   ALT 20 08/06/2007 2138   ALKPHOS 68 08/06/2007 2138   BILITOT 0.5 08/06/2007 2138   GFRNONAA >60 01/19/2022 0323   Lab Results  Component Value Date   CHOL 136 08/26/2009   HDL 54 08/26/2009   LDLCALC 50 08/26/2009   TRIG 161 (H) 08/26/2009   CHOLHDL 2.5 Ratio 08/26/2009      ASSESSMENT AND PLAN 78 y.o. year old female  has a past medical history of Anemia, Arthritis, Cataracts, bilateral, Colon polyps, Diarrhea, Ectopic pregnancy, GERD (gastroesophageal reflux disease), Hypertension, IBS (irritable bowel syndrome), Lactose intolerance, OSA on CPAP (2018), Osteoporosis, Plantar fasciitis, Sickle cell trait (HCC), Sleep apnea, and Vitamin D deficiency. here with:  OSA on CPAP  - CPAP compliance excellent - Good treatment of AHI  - Encourage patient to use CPAP nightly and > 4 hours each night - Did advise to take her machine to her DME company to have  them look at the water  chamber - F/U in 1 year or sooner if needed   Clem Currier, MSN, NP-C 10/03/2023, 10:10 AM [facility] [address], [address] [place], [place] [address] [phone]

## 2023-10-13 ENCOUNTER — Other Ambulatory Visit (HOSPITAL_COMMUNITY): Payer: Self-pay

## 2023-10-16 ENCOUNTER — Other Ambulatory Visit (HOSPITAL_COMMUNITY): Payer: Self-pay

## 2023-10-16 MED ORDER — TRIAMCINOLONE ACETONIDE 0.1 % EX CREA
1.0000 | TOPICAL_CREAM | Freq: Three times a day (TID) | CUTANEOUS | 1 refills | Status: AC
  Filled 2023-10-16 – 2024-02-26 (×4): qty 60, 20d supply, fill #0
  Filled 2024-03-16: qty 60, 20d supply, fill #1

## 2023-10-20 ENCOUNTER — Other Ambulatory Visit (HOSPITAL_COMMUNITY): Payer: Self-pay

## 2023-10-20 MED ORDER — TRIAMCINOLONE ACETONIDE 0.1 % EX CREA
1.0000 | TOPICAL_CREAM | Freq: Three times a day (TID) | CUTANEOUS | 1 refills | Status: AC
Start: 2023-10-20 — End: ?
  Filled 2023-10-20 – 2024-02-07 (×2): qty 60, 20d supply, fill #0
  Filled 2024-04-05: qty 60, 20d supply, fill #1

## 2023-10-23 ENCOUNTER — Other Ambulatory Visit (HOSPITAL_COMMUNITY): Payer: Self-pay

## 2023-10-24 ENCOUNTER — Other Ambulatory Visit (HOSPITAL_COMMUNITY): Payer: Self-pay

## 2023-10-24 ENCOUNTER — Other Ambulatory Visit: Payer: Self-pay

## 2023-10-25 ENCOUNTER — Other Ambulatory Visit: Payer: Self-pay

## 2023-10-25 ENCOUNTER — Other Ambulatory Visit (HOSPITAL_COMMUNITY): Payer: Self-pay

## 2023-11-06 ENCOUNTER — Other Ambulatory Visit (HOSPITAL_COMMUNITY): Payer: Self-pay

## 2023-11-06 ENCOUNTER — Other Ambulatory Visit: Payer: Self-pay

## 2023-11-23 ENCOUNTER — Other Ambulatory Visit (HOSPITAL_COMMUNITY): Payer: Self-pay

## 2023-11-29 ENCOUNTER — Other Ambulatory Visit (HOSPITAL_COMMUNITY): Payer: Self-pay

## 2023-12-08 DIAGNOSIS — Z87891 Personal history of nicotine dependence: Secondary | ICD-10-CM | POA: Diagnosis not present

## 2023-12-08 DIAGNOSIS — M1990 Unspecified osteoarthritis, unspecified site: Secondary | ICD-10-CM | POA: Diagnosis not present

## 2023-12-08 DIAGNOSIS — R32 Unspecified urinary incontinence: Secondary | ICD-10-CM | POA: Diagnosis not present

## 2023-12-08 DIAGNOSIS — G8929 Other chronic pain: Secondary | ICD-10-CM | POA: Diagnosis not present

## 2023-12-08 DIAGNOSIS — M8580 Other specified disorders of bone density and structure, unspecified site: Secondary | ICD-10-CM | POA: Diagnosis not present

## 2023-12-08 DIAGNOSIS — H9193 Unspecified hearing loss, bilateral: Secondary | ICD-10-CM | POA: Diagnosis not present

## 2023-12-08 DIAGNOSIS — E669 Obesity, unspecified: Secondary | ICD-10-CM | POA: Diagnosis not present

## 2023-12-08 DIAGNOSIS — M810 Age-related osteoporosis without current pathological fracture: Secondary | ICD-10-CM | POA: Diagnosis not present

## 2023-12-08 DIAGNOSIS — K219 Gastro-esophageal reflux disease without esophagitis: Secondary | ICD-10-CM | POA: Diagnosis not present

## 2023-12-08 DIAGNOSIS — I10 Essential (primary) hypertension: Secondary | ICD-10-CM | POA: Diagnosis not present

## 2023-12-08 DIAGNOSIS — N3281 Overactive bladder: Secondary | ICD-10-CM | POA: Diagnosis not present

## 2023-12-08 DIAGNOSIS — H401131 Primary open-angle glaucoma, bilateral, mild stage: Secondary | ICD-10-CM | POA: Diagnosis not present

## 2023-12-19 ENCOUNTER — Other Ambulatory Visit (HOSPITAL_COMMUNITY): Payer: Self-pay

## 2023-12-19 DIAGNOSIS — I10 Essential (primary) hypertension: Secondary | ICD-10-CM | POA: Diagnosis not present

## 2023-12-19 DIAGNOSIS — K219 Gastro-esophageal reflux disease without esophagitis: Secondary | ICD-10-CM | POA: Diagnosis not present

## 2023-12-19 DIAGNOSIS — M810 Age-related osteoporosis without current pathological fracture: Secondary | ICD-10-CM | POA: Diagnosis not present

## 2023-12-19 DIAGNOSIS — R7309 Other abnormal glucose: Secondary | ICD-10-CM | POA: Diagnosis not present

## 2023-12-19 DIAGNOSIS — E559 Vitamin D deficiency, unspecified: Secondary | ICD-10-CM | POA: Diagnosis not present

## 2023-12-25 ENCOUNTER — Telehealth: Payer: Self-pay | Admitting: Adult Health

## 2023-12-25 DIAGNOSIS — R208 Other disturbances of skin sensation: Secondary | ICD-10-CM | POA: Diagnosis not present

## 2023-12-25 DIAGNOSIS — G4733 Obstructive sleep apnea (adult) (pediatric): Secondary | ICD-10-CM | POA: Diagnosis not present

## 2023-12-25 DIAGNOSIS — L820 Inflamed seborrheic keratosis: Secondary | ICD-10-CM | POA: Diagnosis not present

## 2023-12-25 DIAGNOSIS — L538 Other specified erythematous conditions: Secondary | ICD-10-CM | POA: Diagnosis not present

## 2023-12-25 DIAGNOSIS — L2989 Other pruritus: Secondary | ICD-10-CM | POA: Diagnosis not present

## 2023-12-25 DIAGNOSIS — Z789 Other specified health status: Secondary | ICD-10-CM | POA: Diagnosis not present

## 2023-12-25 NOTE — Telephone Encounter (Signed)
 Pt reports she will be out of country for about 7 days late Aug-early Sept, she will be without her CPAP, pt wants to discuss if that will be ok, please call to discuss.

## 2023-12-25 NOTE — Telephone Encounter (Signed)
 I called patient.  She reports that her son is taking her on a trip next month.  She does not know where she is going.  She does not know if she will be traveling on an airplane.  She is unsure if she can bring distilled water  with her.  She is also unsure if she can find distilled water  at her vacation location.  I reminded her of the risks of being off of CPAP with her untreated sleep apnea.  Patient would rather travel without her CPAP.  She will resume CPAP therapy as soon as she returns home.

## 2023-12-25 NOTE — Telephone Encounter (Signed)
 Noted

## 2023-12-26 DIAGNOSIS — R0600 Dyspnea, unspecified: Secondary | ICD-10-CM | POA: Diagnosis not present

## 2023-12-26 DIAGNOSIS — Z96641 Presence of right artificial hip joint: Secondary | ICD-10-CM | POA: Diagnosis not present

## 2023-12-26 DIAGNOSIS — Z1331 Encounter for screening for depression: Secondary | ICD-10-CM | POA: Diagnosis not present

## 2023-12-26 DIAGNOSIS — Z0000 Encounter for general adult medical examination without abnormal findings: Secondary | ICD-10-CM | POA: Diagnosis not present

## 2023-12-26 DIAGNOSIS — M1611 Unilateral primary osteoarthritis, right hip: Secondary | ICD-10-CM | POA: Diagnosis not present

## 2023-12-26 DIAGNOSIS — M1990 Unspecified osteoarthritis, unspecified site: Secondary | ICD-10-CM | POA: Diagnosis not present

## 2023-12-26 DIAGNOSIS — M810 Age-related osteoporosis without current pathological fracture: Secondary | ICD-10-CM | POA: Diagnosis not present

## 2023-12-26 DIAGNOSIS — E559 Vitamin D deficiency, unspecified: Secondary | ICD-10-CM | POA: Diagnosis not present

## 2023-12-26 DIAGNOSIS — I10 Essential (primary) hypertension: Secondary | ICD-10-CM | POA: Diagnosis not present

## 2023-12-26 DIAGNOSIS — Z1339 Encounter for screening examination for other mental health and behavioral disorders: Secondary | ICD-10-CM | POA: Diagnosis not present

## 2023-12-26 DIAGNOSIS — K219 Gastro-esophageal reflux disease without esophagitis: Secondary | ICD-10-CM | POA: Diagnosis not present

## 2023-12-27 DIAGNOSIS — R82998 Other abnormal findings in urine: Secondary | ICD-10-CM | POA: Diagnosis not present

## 2024-01-08 ENCOUNTER — Other Ambulatory Visit (HOSPITAL_COMMUNITY): Payer: Self-pay

## 2024-01-08 ENCOUNTER — Other Ambulatory Visit: Payer: Self-pay

## 2024-01-09 ENCOUNTER — Other Ambulatory Visit (HOSPITAL_COMMUNITY): Payer: Self-pay

## 2024-01-09 MED ORDER — PREGABALIN 50 MG PO CAPS
50.0000 mg | ORAL_CAPSULE | Freq: Three times a day (TID) | ORAL | 1 refills | Status: DC
  Filled 2024-01-09: qty 90, 30d supply, fill #0
  Filled 2024-02-07: qty 90, 30d supply, fill #1

## 2024-01-10 ENCOUNTER — Other Ambulatory Visit (HOSPITAL_COMMUNITY): Payer: Self-pay

## 2024-01-10 ENCOUNTER — Other Ambulatory Visit: Payer: Self-pay

## 2024-01-10 DIAGNOSIS — L299 Pruritus, unspecified: Secondary | ICD-10-CM | POA: Diagnosis not present

## 2024-01-10 DIAGNOSIS — H6121 Impacted cerumen, right ear: Secondary | ICD-10-CM | POA: Diagnosis not present

## 2024-01-10 DIAGNOSIS — H6093 Unspecified otitis externa, bilateral: Secondary | ICD-10-CM | POA: Diagnosis not present

## 2024-01-10 MED ORDER — ACETIC ACID 2 % OT SOLN
5.0000 [drp] | Freq: Three times a day (TID) | OTIC | 0 refills | Status: DC
  Filled 2024-01-10 (×2): qty 15, 10d supply, fill #0

## 2024-01-12 ENCOUNTER — Other Ambulatory Visit (HOSPITAL_COMMUNITY): Payer: Self-pay

## 2024-01-15 ENCOUNTER — Other Ambulatory Visit: Payer: Self-pay | Admitting: Cardiovascular Disease

## 2024-01-17 ENCOUNTER — Other Ambulatory Visit: Payer: Self-pay

## 2024-01-17 ENCOUNTER — Other Ambulatory Visit (HOSPITAL_COMMUNITY): Payer: Self-pay

## 2024-01-17 MED ORDER — ISOSORBIDE MONONITRATE ER 30 MG PO TB24
30.0000 mg | ORAL_TABLET | Freq: Every day | ORAL | 0 refills | Status: DC
  Filled 2024-01-17: qty 60, 60d supply, fill #0

## 2024-02-05 DIAGNOSIS — R7989 Other specified abnormal findings of blood chemistry: Secondary | ICD-10-CM | POA: Diagnosis not present

## 2024-02-05 DIAGNOSIS — R0602 Shortness of breath: Secondary | ICD-10-CM | POA: Diagnosis not present

## 2024-02-05 DIAGNOSIS — I4510 Unspecified right bundle-branch block: Secondary | ICD-10-CM | POA: Diagnosis not present

## 2024-02-07 ENCOUNTER — Other Ambulatory Visit: Payer: Self-pay

## 2024-02-07 ENCOUNTER — Other Ambulatory Visit (HOSPITAL_COMMUNITY): Payer: Self-pay

## 2024-02-08 ENCOUNTER — Other Ambulatory Visit (HOSPITAL_COMMUNITY): Payer: Self-pay

## 2024-02-08 ENCOUNTER — Other Ambulatory Visit: Payer: Self-pay

## 2024-02-08 MED ORDER — LISINOPRIL 40 MG PO TABS
40.0000 mg | ORAL_TABLET | Freq: Every evening | ORAL | 3 refills | Status: AC
  Filled 2024-02-08: qty 90, 90d supply, fill #0
  Filled 2024-05-02: qty 90, 90d supply, fill #1

## 2024-02-09 ENCOUNTER — Other Ambulatory Visit (HOSPITAL_COMMUNITY): Payer: Self-pay

## 2024-02-09 ENCOUNTER — Other Ambulatory Visit: Payer: Self-pay

## 2024-02-09 DIAGNOSIS — R0600 Dyspnea, unspecified: Secondary | ICD-10-CM | POA: Diagnosis not present

## 2024-02-09 DIAGNOSIS — H6093 Unspecified otitis externa, bilateral: Secondary | ICD-10-CM | POA: Diagnosis not present

## 2024-02-09 DIAGNOSIS — E041 Nontoxic single thyroid nodule: Secondary | ICD-10-CM | POA: Diagnosis not present

## 2024-02-09 DIAGNOSIS — I10 Essential (primary) hypertension: Secondary | ICD-10-CM | POA: Diagnosis not present

## 2024-02-09 DIAGNOSIS — I872 Venous insufficiency (chronic) (peripheral): Secondary | ICD-10-CM | POA: Diagnosis not present

## 2024-02-09 MED ORDER — TRIAMCINOLONE ACETONIDE 0.1 % EX CREA
1.0000 | TOPICAL_CREAM | Freq: Three times a day (TID) | CUTANEOUS | 1 refills | Status: AC
  Filled 2024-02-09 – 2024-04-22 (×4): qty 60, 20d supply, fill #0
  Filled 2024-06-18: qty 60, 20d supply, fill #1

## 2024-02-09 MED ORDER — ACETIC ACID 2 % OT SOLN
5.0000 [drp] | Freq: Three times a day (TID) | OTIC | 0 refills | Status: AC
  Filled 2024-02-09: qty 15, 20d supply, fill #0

## 2024-02-13 ENCOUNTER — Other Ambulatory Visit: Payer: Self-pay | Admitting: Family Medicine

## 2024-02-13 DIAGNOSIS — E041 Nontoxic single thyroid nodule: Secondary | ICD-10-CM

## 2024-02-15 ENCOUNTER — Ambulatory Visit
Admission: RE | Admit: 2024-02-15 | Discharge: 2024-02-15 | Disposition: A | Discharge status: Home / Self Care | Source: Ambulatory Visit | Attending: Family Medicine | Admitting: Family Medicine

## 2024-02-15 DIAGNOSIS — E041 Nontoxic single thyroid nodule: Secondary | ICD-10-CM

## 2024-02-15 DIAGNOSIS — E042 Nontoxic multinodular goiter: Secondary | ICD-10-CM | POA: Diagnosis not present

## 2024-02-19 ENCOUNTER — Ambulatory Visit: Attending: Cardiovascular Disease | Admitting: Cardiovascular Disease

## 2024-02-19 VITALS — BP 120/70 | HR 67 | Ht 65.0 in | Wt 196.0 lb

## 2024-02-19 DIAGNOSIS — R079 Chest pain, unspecified: Secondary | ICD-10-CM | POA: Diagnosis not present

## 2024-02-19 NOTE — Patient Instructions (Signed)
 Medication Instructions:  .Your physician recommends that you continue on your current medications as directed. Please refer to the Current Medication list given to you today.  *If you need a refill on your cardiac medications before your next appointment, please call your pharmacy*  Lab Work: none If you have labs (blood work) drawn today and your tests are completely normal, you will receive your results only by: MyChart Message (if you have MyChart) OR A paper copy in the mail If you have any lab test that is abnormal or we need to change your treatment, we will call you to review the results.  Testing/Procedures: none  Follow-Up: At [facility], you and your health needs are our priority.  As part of our continuing mission to provide you with exceptional heart care, our providers are all part of one team.  This team includes your primary Cardiologist (physician) and Advanced Practice Providers or APPs (Physician Assistants and Nurse Practitioners) who all work together to provide you with the care you need, when you need it.  Your next appointment:   As needed  Provider:   Lonni Cash, MD    We recommend signing up for the patient portal called MyChart.  Sign up information is provided on this After Visit Summary.  MyChart is used to connect with patients for Virtual Visits (Telemedicine).  Patients are able to view lab/test results, encounter notes, upcoming appointments, etc.  Non-urgent messages can be sent to your provider as well.   To learn more about what you can do with MyChart, go to [email or web address].   Other Instructions

## 2024-02-19 NOTE — Progress Notes (Signed)
 Chief Complaint  Patient presents with   Follow-up    HTN, chest pain   History of Present Illness: 78 yo female with history of GERD, HTN, RBBB, sleep apnea and sickle cell trait here today for follow up. She was seen as a consult in our office in March 2024 for the evaluation of chest pain. She had right hip surgery in September 2023 and had been working with PT. At her first visit here she described upper central chest pain after several minutes of exercise with associated dyspnea. Coronary CTA 08/28/22 with no evidence of CAD. Calcium score of 0. Echo 09/20/22 with LVEF=60-65%. No significant valve disease. She was seen in the ED at [facility] 02/05/24 with chest pain and dyspnea. She described dyspnea with walking on trip to [place]. Troponin negative. Chest CTA without evidence of aortic dissection or PE.   She is here today for follow up. The patient denies any palpitations, lower extremity edema, orthopnea, PND, dizziness, near syncope or syncope. Rare chest pains. She has dyspnea with exertion as noted above.   Primary Care Physician: Larnell Hamilton, MD  Past Medical History:  Diagnosis Date   Anemia    in past   Arthritis    Cataracts, bilateral    Colon polyps    Diarrhea    chronic   Ectopic pregnancy    GERD (gastroesophageal reflux disease)    Hypertension    IBS (irritable bowel syndrome)    Lactose intolerance    OSA on CPAP 2018   Osteoporosis    Plantar fasciitis    Sickle cell trait (HCC)    Sleep apnea    uses c-pap   Vitamin D deficiency     Past Surgical History:  Procedure Laterality Date   ABDOMINAL HYSTERECTOMY     BREAST BIOPSY Left 03/31/1997   benign   CHOLECYSTECTOMY  2008   COLONOSCOPY  2009   negative screening   TOTAL HIP ARTHROPLASTY Right 01/18/2022   Procedure: TOTAL HIP ARTHROPLASTY ANTERIOR APPROACH;  Surgeon: Ernie Cough, MD;  Location: [facility];  Service: Orthopedics;  Laterality: Right;   TUBAL LIGATION      Current Outpatient  Medications  Medication Sig Dispense Refill   amLODipine  (NORVASC ) 10 MG tablet Take 1 tablet (10 mg total) by mouth every evening. 90 tablet 3   ascorbic acid (VITAMIN C) 500 MG tablet Take 500 mg by mouth daily.     Calcium Carb-Cholecalciferol (CALCIUM 600/VITAMIN D3) 600-20 MG-MCG TABS Take 1 tablet by mouth daily.     Cholecalciferol (VITAMIN D-3 PO) Take 1,000 Units by mouth daily.     isosorbide  mononitrate (IMDUR ) 30 MG 24 hr tablet Take 1 tablet (30 mg total) by mouth daily. 60 tablet 0   latanoprost  (XALATAN ) 0.005 % ophthalmic solution Place 1 drop into both eyes every evening. 7.5 mL 3   lisinopril  (ZESTRIL ) 40 MG tablet Take 1 tablet (40 mg total) by mouth every evening. 90 tablet 3   loperamide (IMODIUM A-D) 2 MG tablet Take 2 mg by mouth 4 (four) times daily as needed for diarrhea or loose stools.     methocarbamol  (ROBAXIN ) 500 MG tablet Take 1 tablet (500 mg total) by mouth every 6 (six) hours as needed for muscle spasms or pain. 90 tablet 2   Omega-3 Fatty Acids (FISH OIL) 1000 MG CAPS Take 1,000 mg by mouth daily.     oxybutynin  (DITROPAN ) 5 MG tablet Take 1 tablet (5 mg total) by mouth 2 (two) times  daily. 180 tablet 3   pantoprazole  (PROTONIX ) 40 MG tablet Take 1 tablet (40 mg total) by mouth daily. 90 tablet 3   acetic acid  2 % otic solution Place 5 drops into affected ears 3 (three) times daily. (Patient not taking: Reported on 02/19/2024) 15 mL 0   Cyanocobalamin (VITAMIN B-12 PO) Take 2,500 mcg by mouth daily.     famotidine (PEPCID) 40 MG/5ML suspension Take by mouth daily. (Patient not taking: Reported on 02/19/2024)     latanoprost  (XALATAN ) 0.005 % ophthalmic solution Place 1 drop into both eyes every evening. (Patient not taking: Reported on 02/19/2024) 5 mL 5   lisinopril  (ZESTRIL ) 40 MG tablet Take 1 tablet (40 mg total) by mouth daily. (Patient not taking: Reported on 02/19/2024) 90 tablet 0   methocarbamol  (ROBAXIN ) 500 MG tablet Take 1 tablet (500 mg total) by mouth  every 6 (six) hours as needed for muscles spasm or pain. (Patient not taking: Reported on 02/19/2024) 90 tablet 0   mupirocin  ointment (BACTROBAN ) 2 % Apply to the affected area of skin twice daily for 10 days as directed (Patient not taking: Reported on 02/19/2024) 22 g 0   nitrofurantoin , macrocrystal-monohydrate, (MACROBID ) 100 MG capsule Take 1 capsule (100 mg total) by mouth every 12 (twelve) hours with food for 5 days (Patient not taking: Reported on 02/19/2024) 10 capsule 0   oxybutynin  (DITROPAN ) 5 MG tablet Take 1 tablet (5 mg total) by mouth 2 (two) times daily. (Patient not taking: Reported on 02/19/2024) 180 tablet 3   oxybutynin  (DITROPAN ) 5 MG tablet Take 1 tablet (5 mg total) by mouth 2 (two) times daily. (Patient not taking: Reported on 02/19/2024) 180 tablet 3   pantoprazole  (PROTONIX ) 40 MG tablet Take 1 tablet (40 mg total) by mouth daily. (Patient not taking: Reported on 02/19/2024) 90 tablet 3   predniSONE  (DELTASONE ) 20 MG tablet Take 3 tabs by mouth daily for 5 days, then take 2 tabs by mouth daily for 5 days, then take 1 tab by mouth daily for 5 days. (Patient not taking: Reported on 02/19/2024) 30 tablet 0   predniSONE  (STERAPRED UNI-PAK 48 TAB) 5 MG (48) TBPK tablet Take 1 tablet (5 mg total) by mouth as directed on package. (Patient not taking: Reported on 02/19/2024) 48 tablet 0   pregabalin  (LYRICA ) 50 MG capsule Take 1 capsule (50 mg total) by mouth 3 (three) times daily for feet and hand cramps. (Patient not taking: Reported on 02/19/2024) 90 capsule 1   triamcinolone  cream (KENALOG ) 0.1 % Apply to the affected areas twice daily for 2 weeks. Not safe for long term use (Patient not taking: Reported on 02/19/2024) 30 g 0   triamcinolone  cream (KENALOG ) 0.1 % Apply 1 Application topically 3 (three) times daily. (Patient not taking: Reported on 02/19/2024) 60 g 1   triamcinolone  cream (KENALOG ) 0.1 % Apply 1 Application topically 3 (three) times daily. (Patient not taking: Reported on  02/19/2024) 60 g 1   triamcinolone  cream (KENALOG ) 0.1 % Apply 1 Application topically 3 (three) times daily. (Patient not taking: Reported on 02/19/2024) 60 g 1   valACYclovir  (VALTREX ) 1000 MG tablet Take 1 tablet (1,000 mg total) by mouth 3 (three) times daily for 7 days (Patient not taking: Reported on 02/19/2024) 21 tablet 0   No current facility-administered medications for this visit.    Allergies  Allergen Reactions   Ibuprofen     Pain in stomach/nausea   Penicillins     Pain in stomach Tolerated Cephalosporin Date:  01/18/22     Nsaids     Doctor's order not to take nsaids due to problems with stomach ? Has IBS   Tramadol Other (See Comments)    Social History   Socioeconomic History   Marital status: Single    Spouse name: Not on file   Number of children: 2   Years of education: Not on file   Highest education level: Not on file  Occupational History   Occupation: retired   Occupation: Retired-Telephone operator/CNA   Occupation: retired  Tobacco Use   Smoking status: Former    Current packs/day: 0.00    Types: Cigarettes    Quit date: 05/31/1983    Years since quitting: 40.7   Smokeless tobacco: Never  Vaping Use   Vaping status: Never Used  Substance and Sexual Activity   Alcohol  use: No   Drug use: No   Sexual activity: Not Currently  Other Topics Concern   Not on file  Social History Narrative   Caffeine: tea and hot chocolate    Social Drivers of Corporate investment banker Strain: Not on file  Food Insecurity: Not on file  Transportation Needs: Not on file  Physical Activity: Not on file  Stress: Not on file  Social Connections: Not on file  Intimate Partner Violence: Not on file    Family History  Problem Relation Age of Onset   Heart attack Father    Stroke Father    Stroke Paternal Grandmother    Diabetes Son    Breast cancer Cousin 30   Sleep apnea Cousin    Colon cancer Neg Hx    Esophageal cancer Neg Hx    Stomach cancer Neg Hx      Review of Systems:  As stated in the HPI and otherwise negative.   BP 120/70 (BP Location: Left Arm, Patient Position: Sitting, Cuff Size: Normal)   Pulse 67   Ht 5' 5 (1.651 m)   Wt 196 lb (88.9 kg)   BMI 32.62 kg/m   Physical Examination: General: Well developed, well nourished, NAD  HEENT: OP clear, mucus membranes moist  SKIN: warm, dry. No rashes. Neuro: No focal deficits  Musculoskeletal: Muscle strength 5/5 all ext  Psychiatric: Mood and affect normal  Neck: No JVD, no carotid bruits, no thyromegaly, no lymphadenopathy.  Lungs:Clear bilaterally, no wheezes, rhonci, crackles Cardiovascular: Regular rate and rhythm. No murmurs, gallops or rubs. Abdomen:Soft. Bowel sounds present. Non-tender.  Extremities: No lower extremity edema. Pulses are 2 + in the bilateral DP/PT.  EKG:  EKG is ordered today. The ekg ordered today demonstrates  EKG Interpretation Date/Time:  Monday February 19 2024 11:13:22 EDT Ventricular Rate:  67 PR Interval:  206 QRS Duration:  142 QT Interval:  444 QTC Calculation: 469 R Axis:   2  Text Interpretation: Normal sinus rhythm Right bundle branch block Confirmed by Verlin Bruckner [phone]) on 02/19/2024 11:17:54 AM    Echo 09/20/22:  1. Left ventricular ejection fraction, by estimation, is 60 to 65%. The  left ventricle has normal function. The left ventricle has no regional  wall motion abnormalities. There is mild left ventricular hypertrophy.  Left ventricular diastolic parameters  are consistent with Grade I diastolic dysfunction (impaired relaxation).  The average left ventricular global longitudinal strain is -24.3 %. The  global longitudinal strain is normal.   2. Right ventricular systolic function is normal. The right ventricular  size is normal.   3. Left atrial size was mildly dilated.  4. The mitral valve is abnormal. Trivial mitral valve regurgitation. No  evidence of mitral stenosis.   5. The aortic valve is  tricuspid. There is mild calcification of the  aortic valve. There is mild thickening of the aortic valve. Aortic valve  regurgitation is not visualized. Aortic valve sclerosis is present, with  no evidence of aortic valve stenosis.   6. The inferior vena cava is normal in size with greater than 50%  respiratory variability, suggesting right atrial pressure of 3 mmHg.   Recent Labs: No results found for requested labs within last 365 days.   Lipid Panel    Component Value Date/Time   CHOL 136 08/26/2009 2124   TRIG 161 (H) 08/26/2009 2124   HDL 54 08/26/2009 2124   CHOLHDL 2.5 Ratio 08/26/2009 2124   VLDL 32 08/26/2009 2124   LDLCALC 50 08/26/2009 2124     Wt Readings from Last 3 Encounters:  02/19/24 196 lb (88.9 kg)  10/03/23 194 lb 12.8 oz (88.4 kg)  12/06/22 185 lb (83.9 kg)    Assessment and Plan:   1. Chest pain: Chest pain is felt to be non-cardiac. Coronary CTA in 2024 with no evidence of CAD. Echo in 2024 with normal LV function and no valve disease.   2. Dyspnea: Does not appear to be cardiac related. No volume overload on exam. No known cardiac issues. She has a visit in the Pulmonary office soon.   Labs/ tests ordered today include:  Orders Placed This Encounter  Procedures   EKG 12-Lead   Disposition:   F/U with me in one year  Signed, Lonni Cash, MD, [facility] 02/19/2024 12:25 PM    [facility] [address] [place], [place], [place]  [address] Phone: [phone]; Fax: [phone]

## 2024-02-27 ENCOUNTER — Other Ambulatory Visit (HOSPITAL_COMMUNITY): Payer: Self-pay

## 2024-02-27 ENCOUNTER — Other Ambulatory Visit: Payer: Self-pay

## 2024-02-27 DIAGNOSIS — R0609 Other forms of dyspnea: Secondary | ICD-10-CM | POA: Diagnosis not present

## 2024-02-27 DIAGNOSIS — M79645 Pain in left finger(s): Secondary | ICD-10-CM | POA: Diagnosis not present

## 2024-02-27 DIAGNOSIS — M65312 Trigger thumb, left thumb: Secondary | ICD-10-CM | POA: Diagnosis not present

## 2024-02-27 DIAGNOSIS — E042 Nontoxic multinodular goiter: Secondary | ICD-10-CM | POA: Diagnosis not present

## 2024-02-28 ENCOUNTER — Other Ambulatory Visit (HOSPITAL_COMMUNITY): Payer: Self-pay

## 2024-02-29 ENCOUNTER — Other Ambulatory Visit (HOSPITAL_COMMUNITY): Payer: Self-pay

## 2024-02-29 MED ORDER — PREGABALIN 50 MG PO CAPS
50.0000 mg | ORAL_CAPSULE | Freq: Three times a day (TID) | ORAL | 1 refills | Status: DC
  Filled 2024-03-06 (×2): qty 90, 30d supply, fill #0
  Filled 2024-04-15: qty 90, 30d supply, fill #1

## 2024-03-06 ENCOUNTER — Other Ambulatory Visit (HOSPITAL_COMMUNITY): Payer: Self-pay

## 2024-03-06 ENCOUNTER — Other Ambulatory Visit: Payer: Self-pay

## 2024-03-07 ENCOUNTER — Other Ambulatory Visit (HOSPITAL_COMMUNITY): Payer: Self-pay

## 2024-03-07 ENCOUNTER — Other Ambulatory Visit: Payer: Self-pay

## 2024-03-15 ENCOUNTER — Other Ambulatory Visit: Payer: Self-pay | Admitting: Internal Medicine

## 2024-03-15 DIAGNOSIS — Z1231 Encounter for screening mammogram for malignant neoplasm of breast: Secondary | ICD-10-CM

## 2024-03-16 ENCOUNTER — Other Ambulatory Visit: Payer: Self-pay | Admitting: Cardiovascular Disease

## 2024-03-16 ENCOUNTER — Other Ambulatory Visit (HOSPITAL_COMMUNITY): Payer: Self-pay

## 2024-03-18 ENCOUNTER — Other Ambulatory Visit: Payer: Self-pay

## 2024-03-19 ENCOUNTER — Other Ambulatory Visit (HOSPITAL_COMMUNITY): Payer: Self-pay

## 2024-03-19 ENCOUNTER — Other Ambulatory Visit: Payer: Self-pay

## 2024-03-19 MED ORDER — ISOSORBIDE MONONITRATE ER 30 MG PO TB24
30.0000 mg | ORAL_TABLET | Freq: Every day | ORAL | 3 refills | Status: AC
  Filled 2024-03-19: qty 90, 90d supply, fill #0
  Filled 2024-06-18: qty 90, 90d supply, fill #1

## 2024-03-20 ENCOUNTER — Other Ambulatory Visit (HOSPITAL_COMMUNITY): Payer: Self-pay

## 2024-03-20 ENCOUNTER — Other Ambulatory Visit: Payer: Self-pay

## 2024-03-21 DIAGNOSIS — M65312 Trigger thumb, left thumb: Secondary | ICD-10-CM | POA: Diagnosis not present

## 2024-03-28 ENCOUNTER — Other Ambulatory Visit (HOSPITAL_COMMUNITY): Payer: Self-pay

## 2024-04-03 DIAGNOSIS — Z96641 Presence of right artificial hip joint: Secondary | ICD-10-CM | POA: Diagnosis not present

## 2024-04-03 DIAGNOSIS — M25551 Pain in right hip: Secondary | ICD-10-CM | POA: Diagnosis not present

## 2024-04-05 ENCOUNTER — Other Ambulatory Visit: Payer: Self-pay

## 2024-04-10 DIAGNOSIS — J984 Other disorders of lung: Secondary | ICD-10-CM | POA: Diagnosis not present

## 2024-04-15 ENCOUNTER — Other Ambulatory Visit (HOSPITAL_COMMUNITY): Payer: Self-pay

## 2024-04-15 ENCOUNTER — Other Ambulatory Visit: Payer: Self-pay

## 2024-04-16 ENCOUNTER — Other Ambulatory Visit (HOSPITAL_COMMUNITY): Payer: Self-pay

## 2024-04-16 DIAGNOSIS — E669 Obesity, unspecified: Secondary | ICD-10-CM | POA: Diagnosis not present

## 2024-04-16 DIAGNOSIS — R0609 Other forms of dyspnea: Secondary | ICD-10-CM | POA: Diagnosis not present

## 2024-04-16 DIAGNOSIS — J984 Other disorders of lung: Secondary | ICD-10-CM | POA: Diagnosis not present

## 2024-04-16 MED ORDER — LATANOPROST 0.005 % OP SOLN
1.0000 [drp] | Freq: Every evening | OPHTHALMIC | 3 refills | Status: AC
  Filled 2024-05-02: qty 7.5, 75d supply, fill #0

## 2024-04-18 ENCOUNTER — Ambulatory Visit
Admission: RE | Admit: 2024-04-18 | Discharge: 2024-04-18 | Disposition: A | Source: Ambulatory Visit | Attending: Internal Medicine | Admitting: Internal Medicine

## 2024-04-18 DIAGNOSIS — Z1231 Encounter for screening mammogram for malignant neoplasm of breast: Secondary | ICD-10-CM | POA: Diagnosis not present

## 2024-04-22 ENCOUNTER — Other Ambulatory Visit (HOSPITAL_COMMUNITY): Payer: Self-pay

## 2024-04-22 ENCOUNTER — Other Ambulatory Visit: Payer: Self-pay

## 2024-05-02 ENCOUNTER — Other Ambulatory Visit (HOSPITAL_COMMUNITY): Payer: Self-pay

## 2024-05-02 ENCOUNTER — Other Ambulatory Visit: Payer: Self-pay

## 2024-05-05 ENCOUNTER — Other Ambulatory Visit (HOSPITAL_COMMUNITY): Payer: Self-pay

## 2024-05-05 ENCOUNTER — Encounter (HOSPITAL_COMMUNITY): Payer: Self-pay | Admitting: Pharmacist

## 2024-05-13 ENCOUNTER — Other Ambulatory Visit (HOSPITAL_COMMUNITY): Payer: Self-pay

## 2024-05-14 ENCOUNTER — Other Ambulatory Visit (HOSPITAL_COMMUNITY): Payer: Self-pay

## 2024-05-14 MED ORDER — PREGABALIN 50 MG PO CAPS
50.0000 mg | ORAL_CAPSULE | Freq: Three times a day (TID) | ORAL | 1 refills | Status: AC
  Filled 2024-05-14: qty 90, 30d supply, fill #0
  Filled 2024-06-18: qty 90, 30d supply, fill #1

## 2024-05-14 MED ORDER — PANTOPRAZOLE SODIUM 40 MG PO TBEC
40.0000 mg | DELAYED_RELEASE_TABLET | Freq: Every day | ORAL | 3 refills | Status: AC
  Filled 2024-05-14 – 2024-07-02 (×2): qty 90, 90d supply, fill #0

## 2024-05-15 ENCOUNTER — Other Ambulatory Visit (HOSPITAL_COMMUNITY): Payer: Self-pay

## 2024-05-17 ENCOUNTER — Other Ambulatory Visit (HOSPITAL_COMMUNITY): Payer: Self-pay

## 2024-05-27 ENCOUNTER — Telehealth: Payer: Self-pay | Admitting: Adult Health

## 2024-05-27 NOTE — Telephone Encounter (Signed)
 Pt reports she has not been using her CPAP for the last week as a result of not feeling well, she'd like a call to discuss how this could have an impact on her.

## 2024-05-27 NOTE — Telephone Encounter (Addendum)
 I called the patient and informed her of how the insurance compliance works and went over previous sleep study results and reasons why it is important to use sleep machine.   - patient tested positive for the flu on Friday and has not been feeling her best since the beginning of last week. She has been waking up with flem in her throat since she has stopped using the machine. She plans to try using the machine tonight to see if it helps with her sleeping difficulties while sick.

## 2024-06-03 ENCOUNTER — Other Ambulatory Visit (HOSPITAL_COMMUNITY): Payer: Self-pay

## 2024-06-06 ENCOUNTER — Other Ambulatory Visit (HOSPITAL_BASED_OUTPATIENT_CLINIC_OR_DEPARTMENT_OTHER): Payer: Self-pay

## 2024-06-06 ENCOUNTER — Other Ambulatory Visit (HOSPITAL_COMMUNITY): Payer: Self-pay

## 2024-06-06 MED ORDER — LATANOPROST 0.005 % OP SOLN
1.0000 [drp] | Freq: Every morning | OPHTHALMIC | 3 refills | Status: AC
  Filled 2024-06-06: qty 7.5, 150d supply, fill #0
  Filled 2024-06-07: qty 7.5, 75d supply, fill #0

## 2024-06-07 ENCOUNTER — Other Ambulatory Visit (HOSPITAL_COMMUNITY): Payer: Self-pay

## 2024-06-07 ENCOUNTER — Other Ambulatory Visit: Payer: Self-pay

## 2024-06-18 ENCOUNTER — Other Ambulatory Visit (HOSPITAL_COMMUNITY): Payer: Self-pay

## 2024-06-18 ENCOUNTER — Other Ambulatory Visit: Payer: Self-pay

## 2024-06-19 ENCOUNTER — Other Ambulatory Visit: Payer: Self-pay | Admitting: *Deleted

## 2024-06-19 DIAGNOSIS — G4733 Obstructive sleep apnea (adult) (pediatric): Secondary | ICD-10-CM

## 2024-06-19 NOTE — Telephone Encounter (Signed)
 Spoke to patient has new insurance will have to change DME to [facility] . Will come by office or send picture of new insurance card front and back through mychart  Pt understands has to have copy of insurance card to transfer care to new DME  and process order for cpap supplies

## 2024-06-20 ENCOUNTER — Other Ambulatory Visit (HOSPITAL_COMMUNITY): Payer: Self-pay

## 2024-06-20 NOTE — Telephone Encounter (Signed)
 Faxed over orders to [facility] this afternoon

## 2024-07-02 ENCOUNTER — Other Ambulatory Visit: Payer: Self-pay

## 2024-07-02 ENCOUNTER — Other Ambulatory Visit (HOSPITAL_COMMUNITY): Payer: Self-pay

## 2024-10-08 ENCOUNTER — Ambulatory Visit: Admitting: Adult Health
